# Patient Record
Sex: Male | Born: 1950 | Race: White | Hispanic: No | Smoking: Former smoker
Health system: Southern US, Community
[De-identification: ages and names within clinical notes are randomized; demographics above are authoritative.]

## PROBLEM LIST (undated history)

## (undated) DIAGNOSIS — I10 Essential (primary) hypertension: Secondary | ICD-10-CM

## (undated) DIAGNOSIS — D6851 Activated protein C resistance: Secondary | ICD-10-CM

## (undated) DIAGNOSIS — I82409 Acute embolism and thrombosis of unspecified deep veins of unspecified lower extremity: Secondary | ICD-10-CM

## (undated) DIAGNOSIS — F329 Major depressive disorder, single episode, unspecified: Secondary | ICD-10-CM

## (undated) DIAGNOSIS — R269 Unspecified abnormalities of gait and mobility: Secondary | ICD-10-CM

## (undated) DIAGNOSIS — R413 Other amnesia: Secondary | ICD-10-CM

## (undated) DIAGNOSIS — F32A Depression, unspecified: Secondary | ICD-10-CM

## (undated) DIAGNOSIS — L409 Psoriasis, unspecified: Secondary | ICD-10-CM

## (undated) DIAGNOSIS — N289 Disorder of kidney and ureter, unspecified: Secondary | ICD-10-CM

## (undated) DIAGNOSIS — C801 Malignant (primary) neoplasm, unspecified: Secondary | ICD-10-CM

## (undated) DIAGNOSIS — E119 Type 2 diabetes mellitus without complications: Secondary | ICD-10-CM

## (undated) HISTORY — DX: Unspecified abnormalities of gait and mobility: R26.9

## (undated) HISTORY — DX: Psoriasis, unspecified: L40.9

## (undated) HISTORY — DX: Other amnesia: R41.3

## (undated) HISTORY — PX: NEPHRECTOMY: SHX65

---

## 2009-06-02 DEATH — deceased

## 2011-04-21 ENCOUNTER — Ambulatory Visit: Payer: Self-pay | Admitting: Oncology

## 2011-04-22 LAB — CEA: CEA: 1.3 ng/mL (ref 0.0–4.7)

## 2011-05-03 ENCOUNTER — Ambulatory Visit: Payer: Self-pay | Admitting: Oncology

## 2011-08-24 ENCOUNTER — Ambulatory Visit: Payer: Self-pay | Admitting: Unknown Physician Specialty

## 2012-02-15 ENCOUNTER — Ambulatory Visit: Payer: Self-pay | Admitting: Oncology

## 2012-02-16 LAB — CEA: CEA: 1.5 ng/mL (ref 0.0–4.7)

## 2012-03-02 ENCOUNTER — Ambulatory Visit: Payer: Self-pay | Admitting: Oncology

## 2012-05-22 ENCOUNTER — Ambulatory Visit: Payer: Self-pay | Admitting: Oncology

## 2012-05-30 ENCOUNTER — Ambulatory Visit: Payer: Self-pay | Admitting: Unknown Physician Specialty

## 2012-05-31 LAB — PATHOLOGY REPORT

## 2012-08-02 ENCOUNTER — Ambulatory Visit: Payer: Self-pay | Admitting: Oncology

## 2012-08-18 ENCOUNTER — Ambulatory Visit: Payer: Self-pay | Admitting: Oncology

## 2012-08-25 LAB — CBC CANCER CENTER
Eosinophil #: 0.1 x10 3/mm (ref 0.0–0.7)
Eosinophil %: 1.9 %
HCT: 45.7 % (ref 40.0–52.0)
HGB: 15.4 g/dL (ref 13.0–18.0)
MCH: 30.6 pg (ref 26.0–34.0)
MCHC: 33.8 g/dL (ref 32.0–36.0)
MCV: 91 fL (ref 80–100)
Neutrophil #: 5.1 x10 3/mm (ref 1.4–6.5)
Platelet: 146 x10 3/mm — ABNORMAL LOW (ref 150–440)

## 2012-08-25 LAB — BASIC METABOLIC PANEL
BUN: 39 mg/dL — ABNORMAL HIGH (ref 7–18)
Chloride: 96 mmol/L — ABNORMAL LOW (ref 98–107)
Creatinine: 2.36 mg/dL — ABNORMAL HIGH (ref 0.60–1.30)
EGFR (African American): 33 — ABNORMAL LOW
Glucose: 509 mg/dL (ref 65–99)
Sodium: 131 mmol/L — ABNORMAL LOW (ref 136–145)

## 2012-08-28 LAB — CEA: CEA: 2.7 ng/mL (ref 0.0–4.7)

## 2012-09-02 ENCOUNTER — Ambulatory Visit: Payer: Self-pay | Admitting: Oncology

## 2013-02-20 ENCOUNTER — Ambulatory Visit: Payer: Self-pay | Admitting: Oncology

## 2016-09-19 ENCOUNTER — Emergency Department (HOSPITAL_BASED_OUTPATIENT_CLINIC_OR_DEPARTMENT_OTHER): Payer: Medicare Other

## 2016-09-19 ENCOUNTER — Observation Stay (HOSPITAL_BASED_OUTPATIENT_CLINIC_OR_DEPARTMENT_OTHER)
Admission: EM | Admit: 2016-09-19 | Discharge: 2016-09-20 | Disposition: A | Discharge status: Home / Self Care | Payer: Medicare Other | Attending: Internal Medicine | Admitting: Internal Medicine

## 2016-09-19 ENCOUNTER — Encounter (HOSPITAL_BASED_OUTPATIENT_CLINIC_OR_DEPARTMENT_OTHER): Payer: Self-pay | Admitting: Emergency Medicine

## 2016-09-19 DIAGNOSIS — F32A Depression, unspecified: Secondary | ICD-10-CM | POA: Diagnosis present

## 2016-09-19 DIAGNOSIS — Z85048 Personal history of other malignant neoplasm of rectum, rectosigmoid junction, and anus: Secondary | ICD-10-CM | POA: Insufficient documentation

## 2016-09-19 DIAGNOSIS — N183 Chronic kidney disease, stage 3 unspecified: Secondary | ICD-10-CM | POA: Diagnosis present

## 2016-09-19 DIAGNOSIS — R51 Headache: Secondary | ICD-10-CM | POA: Diagnosis not present

## 2016-09-19 DIAGNOSIS — I252 Old myocardial infarction: Secondary | ICD-10-CM | POA: Insufficient documentation

## 2016-09-19 DIAGNOSIS — E119 Type 2 diabetes mellitus without complications: Secondary | ICD-10-CM

## 2016-09-19 DIAGNOSIS — I6523 Occlusion and stenosis of bilateral carotid arteries: Secondary | ICD-10-CM | POA: Diagnosis not present

## 2016-09-19 DIAGNOSIS — I10 Essential (primary) hypertension: Secondary | ICD-10-CM | POA: Diagnosis not present

## 2016-09-19 DIAGNOSIS — D6851 Activated protein C resistance: Secondary | ICD-10-CM | POA: Diagnosis not present

## 2016-09-19 DIAGNOSIS — I639 Cerebral infarction, unspecified: Secondary | ICD-10-CM | POA: Diagnosis not present

## 2016-09-19 DIAGNOSIS — F329 Major depressive disorder, single episode, unspecified: Secondary | ICD-10-CM | POA: Diagnosis not present

## 2016-09-19 DIAGNOSIS — N179 Acute kidney failure, unspecified: Secondary | ICD-10-CM | POA: Diagnosis not present

## 2016-09-19 DIAGNOSIS — G4700 Insomnia, unspecified: Secondary | ICD-10-CM | POA: Diagnosis not present

## 2016-09-19 DIAGNOSIS — R4701 Aphasia: Secondary | ICD-10-CM | POA: Diagnosis not present

## 2016-09-19 DIAGNOSIS — I129 Hypertensive chronic kidney disease with stage 1 through stage 4 chronic kidney disease, or unspecified chronic kidney disease: Secondary | ICD-10-CM | POA: Diagnosis not present

## 2016-09-19 DIAGNOSIS — E1122 Type 2 diabetes mellitus with diabetic chronic kidney disease: Secondary | ICD-10-CM | POA: Insufficient documentation

## 2016-09-19 DIAGNOSIS — Z91041 Radiographic dye allergy status: Secondary | ICD-10-CM | POA: Diagnosis not present

## 2016-09-19 DIAGNOSIS — Z7901 Long term (current) use of anticoagulants: Secondary | ICD-10-CM | POA: Diagnosis not present

## 2016-09-19 DIAGNOSIS — I071 Rheumatic tricuspid insufficiency: Secondary | ICD-10-CM | POA: Insufficient documentation

## 2016-09-19 DIAGNOSIS — Z794 Long term (current) use of insulin: Secondary | ICD-10-CM | POA: Insufficient documentation

## 2016-09-19 DIAGNOSIS — F5101 Primary insomnia: Secondary | ICD-10-CM

## 2016-09-19 DIAGNOSIS — Z905 Acquired absence of kidney: Secondary | ICD-10-CM | POA: Diagnosis not present

## 2016-09-19 DIAGNOSIS — Z86718 Personal history of other venous thrombosis and embolism: Secondary | ICD-10-CM

## 2016-09-19 DIAGNOSIS — Z85528 Personal history of other malignant neoplasm of kidney: Secondary | ICD-10-CM | POA: Diagnosis not present

## 2016-09-19 DIAGNOSIS — R2990 Unspecified symptoms and signs involving the nervous system: Secondary | ICD-10-CM | POA: Diagnosis present

## 2016-09-19 HISTORY — DX: Type 2 diabetes mellitus without complications: E11.9

## 2016-09-19 HISTORY — DX: Malignant (primary) neoplasm, unspecified: C80.1

## 2016-09-19 HISTORY — DX: Activated protein C resistance: D68.51

## 2016-09-19 HISTORY — DX: Disorder of kidney and ureter, unspecified: N28.9

## 2016-09-19 HISTORY — DX: Major depressive disorder, single episode, unspecified: F32.9

## 2016-09-19 HISTORY — DX: Depression, unspecified: F32.A

## 2016-09-19 HISTORY — DX: Essential (primary) hypertension: I10

## 2016-09-19 HISTORY — DX: Acute embolism and thrombosis of unspecified deep veins of unspecified lower extremity: I82.409

## 2016-09-19 LAB — CBC
HEMATOCRIT: 45.9 % (ref 39.0–52.0)
Hemoglobin: 15.5 g/dL (ref 13.0–17.0)
MCH: 31.3 pg (ref 26.0–34.0)
MCHC: 33.8 g/dL (ref 30.0–36.0)
MCV: 92.5 fL (ref 78.0–100.0)
PLATELETS: 172 10*3/uL (ref 150–400)
RBC: 4.96 MIL/uL (ref 4.22–5.81)
RDW: 13.2 % (ref 11.5–15.5)
WBC: 7.1 10*3/uL (ref 4.0–10.5)

## 2016-09-19 LAB — GLUCOSE, CAPILLARY: Glucose-Capillary: 89 mg/dL (ref 65–99)

## 2016-09-19 LAB — DIFFERENTIAL
BASOS ABS: 0 10*3/uL (ref 0.0–0.1)
BASOS PCT: 0 %
EOS ABS: 0.1 10*3/uL (ref 0.0–0.7)
Eosinophils Relative: 2 %
Lymphocytes Relative: 36 %
Lymphs Abs: 2.6 10*3/uL (ref 0.7–4.0)
Monocytes Absolute: 0.5 10*3/uL (ref 0.1–1.0)
Monocytes Relative: 8 %
NEUTROS ABS: 3.8 10*3/uL (ref 1.7–7.7)
NEUTROS PCT: 54 %

## 2016-09-19 LAB — CBG MONITORING, ED: Glucose-Capillary: 240 mg/dL — ABNORMAL HIGH (ref 65–99)

## 2016-09-19 LAB — COMPREHENSIVE METABOLIC PANEL
ALBUMIN: 3.8 g/dL (ref 3.5–5.0)
ALT: 32 U/L (ref 17–63)
AST: 34 U/L (ref 15–41)
Alkaline Phosphatase: 41 U/L (ref 38–126)
Anion gap: 7 (ref 5–15)
BUN: 33 mg/dL — AB (ref 6–20)
CHLORIDE: 103 mmol/L (ref 101–111)
CO2: 28 mmol/L (ref 22–32)
CREATININE: 2.19 mg/dL — AB (ref 0.61–1.24)
Calcium: 9.8 mg/dL (ref 8.9–10.3)
GFR calc Af Amer: 34 mL/min — ABNORMAL LOW (ref >60)
GFR, EST NON AFRICAN AMERICAN: 30 mL/min — AB (ref >60)
GLUCOSE: 198 mg/dL — AB (ref 65–99)
POTASSIUM: 4.2 mmol/L (ref 3.5–5.1)
SODIUM: 138 mmol/L (ref 135–145)
Total Bilirubin: 0.7 mg/dL (ref 0.3–1.2)
Total Protein: 7.4 g/dL (ref 6.5–8.1)

## 2016-09-19 LAB — TROPONIN I

## 2016-09-19 LAB — APTT: APTT: 44 s — AB (ref 24–36)

## 2016-09-19 LAB — PROTIME-INR
INR: 2.1
Prothrombin Time: 23.9 seconds — ABNORMAL HIGH (ref 11.4–15.2)

## 2016-09-19 MED ORDER — ATORVASTATIN CALCIUM 10 MG PO TABS
20.0000 mg | ORAL_TABLET | Freq: Every day | ORAL | Status: DC
  Administered 2016-09-20: 20 mg via ORAL
  Filled 2016-09-19: qty 2

## 2016-09-19 MED ORDER — OMEGA-3-ACID ETHYL ESTERS 1 G PO CAPS
2.0000 g | ORAL_CAPSULE | Freq: Every day | ORAL | Status: DC
  Administered 2016-09-20: 2 g via ORAL
  Filled 2016-09-19: qty 2

## 2016-09-19 MED ORDER — ACETAMINOPHEN 160 MG/5ML PO SOLN: 650.0000 mg | ORAL | Status: DC | PRN

## 2016-09-19 MED ORDER — SENNOSIDES-DOCUSATE SODIUM 8.6-50 MG PO TABS: 1.0000 | ORAL_TABLET | Freq: Every evening | ORAL | Status: DC | PRN

## 2016-09-19 MED ORDER — INSULIN ASPART 100 UNIT/ML ~~LOC~~ SOLN: 0.0000 [IU] | Freq: Every day | SUBCUTANEOUS | Status: DC

## 2016-09-19 MED ORDER — ADULT MULTIVITAMIN W/MINERALS CH
1.0000 | ORAL_TABLET | Freq: Every day | ORAL | Status: DC
  Administered 2016-09-20: 1 via ORAL
  Filled 2016-09-19: qty 1

## 2016-09-19 MED ORDER — GABAPENTIN 300 MG PO CAPS
300.0000 mg | ORAL_CAPSULE | Freq: Every day | ORAL | Status: DC
  Administered 2016-09-19: 300 mg via ORAL
  Filled 2016-09-19: qty 1

## 2016-09-19 MED ORDER — ACETAMINOPHEN 325 MG PO TABS: 650.0000 mg | ORAL_TABLET | ORAL | Status: DC | PRN

## 2016-09-19 MED ORDER — SODIUM CHLORIDE 0.9 % IV SOLN
INTRAVENOUS | Status: DC
  Administered 2016-09-19: 22:00:00 via INTRAVENOUS

## 2016-09-19 MED ORDER — STROKE: EARLY STAGES OF RECOVERY BOOK
Freq: Once | Status: AC
  Administered 2016-09-19: 22:00:00

## 2016-09-19 MED ORDER — ACETAMINOPHEN 650 MG RE SUPP: 650.0000 mg | RECTAL | Status: DC | PRN

## 2016-09-19 MED ORDER — ALPRAZOLAM 0.5 MG PO TABS
0.5000 mg | ORAL_TABLET | Freq: Every evening | ORAL | Status: DC | PRN
  Administered 2016-09-19: 0.5 mg via ORAL
  Filled 2016-09-19: qty 1

## 2016-09-19 MED ORDER — FENOFIBRATE 160 MG PO TABS
160.0000 mg | ORAL_TABLET | Freq: Every day | ORAL | Status: DC
  Administered 2016-09-20: 160 mg via ORAL
  Filled 2016-09-19: qty 1

## 2016-09-19 MED ORDER — GABAPENTIN 300 MG PO CAPS: 300.0000 mg | ORAL_CAPSULE | Freq: Two times a day (BID) | ORAL | Status: DC | PRN

## 2016-09-19 MED ORDER — INSULIN ASPART 100 UNIT/ML ~~LOC~~ SOLN
0.0000 [IU] | Freq: Three times a day (TID) | SUBCUTANEOUS | Status: DC
  Administered 2016-09-20: 8 [IU] via SUBCUTANEOUS

## 2016-09-19 NOTE — ED Notes (Signed)
Patient transported to CT 

## 2016-09-19 NOTE — Progress Notes (Signed)
ANTICOAGULATION CONSULT NOTE - Initial Consult  Pharmacy Consult for Warfarin Indication: factor 7 defiiciency  Allergies  Allergen Reactions  . Ivp Dye [Iodinated Diagnostic Agents] Other (See Comments)    Cannot take due to kidney disease     Patient Measurements: Height: 6' (182.9 cm) Weight: 291 lb 0.1 oz (132 kg) IBW/kg (Calculated) : 77.6  Vital Signs: Temp: 98.2 F (36.8 C) (02/18 2053) Temp Source: Oral (02/18 2053) BP: 174/79 (02/18 2053) Pulse Rate: 89 (02/18 2053)  Labs:  Recent Labs  09/19/16 1545  HGB 15.5  HCT 45.9  PLT 172  APTT 44*  LABPROT 23.9*  INR 2.10  CREATININE 2.19*  TROPONINI <0.03    Estimated Creatinine Clearance: 46.6 mL/min (by C-G formula based on SCr of 2.19 mg/dL (H)).   Medical History: Past Medical History:  Diagnosis Date  . Cancer ([place])   . Depression   . Diabetes mellitus without complication ([place])   . DVT (deep venous thrombosis) ([place])   . Factor V Leiden ([place])   . Hypertension   . MI (myocardial infarction)   . Renal disorder     Assessment: 66 year old male on Coumadin PTA for factor 7 deficiency INR = 2.1 on admission, last dose today Dose PTA = 3 mg Thur/Sun, 6 mg other days  Goal of Therapy:  INR 2-3 Monitor platelets by anticoagulation protocol: Yes   Plan:  No further Coumadin needed today Daily INR  Thank you Anette Guarneri, PharmD [phone]  09/19/2016,9:50 PM

## 2016-09-19 NOTE — Progress Notes (Signed)
Paged MD to notify that patient is on the unit and needs orders.

## 2016-09-19 NOTE — ED Provider Notes (Signed)
[facility] DEPT [facility] Provider Note   CSN: [id number] Arrival date & time: 09/19/16  1539   By signing my name below, I, Soijett Blue, attest that this documentation has been prepared under the direction and in the presence of Ripley Lovecchio Lyn Viann Nielson, MD. Electronically Signed: Soijett Blue, ED Scribe. 09/19/16. 3:51 PM.  History   Chief Complaint Chief Complaint  Patient presents with  . Aphasia    HPI Chad Chen is a 66 y.o. male with a PMHx of HTN, DM, colon CA, kidney CA, who presents to the Emergency Department complaining of speech difficulty onset 40 minutes ago PTA. Pt notes that he was eating at [facility] with his girlfriend, when his girlfriend noticed that it took him 8 minutes to form a sentence and get his thoughts together. Pt states that he feels as if he is at his baseline at this time. Pt reports associated symptoms of HA. He hasn't tried any medications for the relief of his symptoms. He denies weakness, fever, chills, and any other symptoms. Pt reports that he takes daily coumadin. Pt notes that he has had a left nephrectomy in 2011. Denies hx of stroke or MI in the past.    The history is provided by the patient and a significant other. No language interpreter was used.    Past Medical History:  Diagnosis Date  . Cancer ([place])   . Diabetes mellitus without complication ([place])   . Hypertension     There are no active problems to display for this patient.   Past Surgical History:  Procedure Laterality Date  . NEPHRECTOMY Left        Home Medications    Prior to Admission medications   Medication Sig Start Date End Date Taking? Authorizing Provider  warfarin (COUMADIN) 10 MG tablet Take 10 mg by mouth daily.   Yes Historical Provider, MD    Family History No family history on file.  Social History Social History  Substance Use Topics  . Smoking status: Never Smoker  . Smokeless tobacco: Never Used  . Alcohol use No     Allergies   Patient has  no known allergies.   Review of Systems Review of Systems  Constitutional: Negative for chills and fever.  Neurological: Positive for speech difficulty and headaches. Negative for weakness.     Physical Exam Updated Vital Signs BP 132/88   Pulse 88   Temp 97.8 F (36.6 C) (Oral)   Resp 15   Ht 6\' 2"  (1.88 m)   Wt 291 lb (132 kg)   SpO2 94%   BMI 37.36 kg/m   Physical Exam  Constitutional: He is oriented to person, place, and time. He appears well-developed and well-nourished. No distress.  Obese white male.  HENT:  Head: Normocephalic and atraumatic.  Eyes: EOM are normal.  Neck: Neck supple.  Cardiovascular: Normal rate, regular rhythm and normal heart sounds.  Exam reveals no gallop and no friction rub.   No murmur heard. Pulmonary/Chest: Effort normal and breath sounds normal. No respiratory distress. He has no wheezes. He has no rales.  Abdominal: He exhibits no distension.  Midline scar  Musculoskeletal: Normal range of motion.  Neurological: He is alert and oriented to person, place, and time.  Cranial nerves 2-12 intact. Negative pronator drift. Moving all four extremities.  Skin: Skin is warm and dry.  Psychiatric: He has a normal mood and affect. His behavior is normal.  Nursing note and vitals reviewed.    ED Treatments / Results  DIAGNOSTIC STUDIES: Oxygen Saturation is 95% on RA, adequate by my interpretation.    COORDINATION OF CARE: 3:46 PM Discussed treatment plan with pt at bedside which includes EKG, labs, CT head, and pt agreed to plan.   Labs (all labs ordered are listed, but only abnormal results are displayed) Labs Reviewed  PROTIME-INR - Abnormal; Notable for the following:       Result Value   Prothrombin Time 23.9 (*)    All other components within normal limits  APTT - Abnormal; Notable for the following:    aPTT 44 (*)    All other components within normal limits  COMPREHENSIVE METABOLIC PANEL - Abnormal; Notable for the  following:    Glucose, Bld 198 (*)    BUN 33 (*)    Creatinine, Ser 2.19 (*)    GFR calc non Af Amer 30 (*)    GFR calc Af Amer 34 (*)    All other components within normal limits  CBG MONITORING, ED - Abnormal; Notable for the following:    Glucose-Capillary 240 (*)    All other components within normal limits  CBC  DIFFERENTIAL  TROPONIN I    EKG  EKG Interpretation  Date/Time:  Sunday September 19 2016 15:43:42 EST Ventricular Rate:  81 PR Interval:    QRS Duration: 96 QT Interval:  367 QTC Calculation: 426 R Axis:   -28 Text Interpretation:  Sinus rhythm Probable left ventricular hypertrophy Inferior infarct, old Anterior Q waves, possibly due to LVH Baseline wander in lead(s) V3 V5 no evidence of acute ischemia Confirmed by Gerald Leitz ([id number]) on 09/19/2016 4:35:50 PM       Radiology Ct Head Wo Contrast  Result Date: 09/19/2016 CLINICAL DATA:  Patient with difficulty speaking. EXAM: CT HEAD WITHOUT CONTRAST TECHNIQUE: Contiguous axial images were obtained from the base of the skull through the vertex without intravenous contrast. COMPARISON:  None. FINDINGS: Brain: Ventricles and sulci are appropriate for patient's age. Periventricular and subcortical white matter hypodensity compatible with chronic microvascular ischemic changes. No evidence for acute cortically based infarct, intracranial hemorrhage, mass lesion or mass-effect. Small left basal ganglia lacunar infarct. Vascular: Unremarkable. Skull: Intact. Sinuses/Orbits: Paranasal sinuses are well aerated. Mastoid air cells unremarkable. Other: None. IMPRESSION: No acute intracranial process. Chronic microvascular ischemic changes. Electronically Signed   By: Lovey Newcomer M.D.   On: 09/19/2016 16:23    Procedures Procedures (including critical care time)  Medications Ordered in ED Medications - No data to display   Initial Impression / Assessment and Plan / ED Course  I have reviewed the triage vital signs and  the nursing notes.  Pertinent labs & imaging results that were available during my care of the patient were reviewed by me and considered in my medical decision making (see chart for details).    Patient is a 66 year old male presenting with acute onset of inability to speak. This is since resolved. Patient was at [facility] with his girlfriend when this happened. Lasted approximately 30 minutes. Patient was using [place] words but is not making sense and unable to finish the conversation. Patient had no weakness of the time. Patient is on Coumadin for factor V Leiden, has a history of hypertension hyperlipidemia diabetes and kidney neoplasm.  Suspicious for TIA. INR is at goal 2.1.  Will admit to [facility] with neurology following.  4:54 PM discussed neurology.  Final Clinical Impressions(s) / ED Diagnoses   Final diagnoses:  None    New Prescriptions New Prescriptions  No medications on file   I personally performed the services described in this documentation, which was scribed in my presence. The recorded information has been reviewed and is accurate.       Kamau Weatherall Julio Alm, MD 09/19/16 1654

## 2016-09-19 NOTE — ED Notes (Signed)
ED Provider at bedside. 

## 2016-09-19 NOTE — Progress Notes (Addendum)
66 y.o. male with a PMHx of HTN, DM, colon CA, kidney CA, who presents to the Emergency Department complaining of speech difficulty onset 40 minutes ago prior to arrival. tx from [facility] Accepted to stroke floor for CVA work up On coumadin for factor 7 deficiency,INR 2.1

## 2016-09-19 NOTE — ED Triage Notes (Signed)
Pt girlfriend states it took him 8 minutes to find his words during their conversation. Pt is currently able to speak clearly and answer questions appropriately.

## 2016-09-19 NOTE — Progress Notes (Signed)
Patient admitted from Urgent care. Patient alert and oriented x 4. Patient oriented to room and made comfortable. Tele placed and was verified

## 2016-09-19 NOTE — ED Triage Notes (Signed)
Sudden onset of difficulty finding words 30 min ago. Pt advises he knows what he wants to say but is having difficulty finding words. Pt denies pain, denies weakness in arms and legs.

## 2016-09-19 NOTE — H&P (Signed)
History and Physical    Chad Chen [id number] DOB: 1951/06/14 DOA: 09/19/2016  PCP: No PCP Per Patient   Patient coming from: Home, by way of [facility]   Chief Complaint: Transient expressive aphasia   HPI: Chad Chen is a 66 y.o. male with medical history significant for factor V Leiden mutation and history of DVT on Coumadin, insulin-dependent diabetes mellitus, depression, hypertension, chronic kidney disease stage III, and remote renal and colorectal cancers, both status post resection, now presenting in transfer from [place] for evaluation of transient expressive aphasia. Patient reports that he was in his usual state of health this morning and was having an uneventful day until, while eating lunch at a restaurant with his girlfriend, when he had difficulty with word-finding and reportedly spent close to 30 minutes trying to tell his girlfriend about something he had seen on TV. He describes starting a sentence, but then being unable to find the words to complete his statement. There was a very mild frontal headache that had developed shortly prior to this, which is unusual for the patient. He was taken to [facility] for evaluation, and upon his arrival, reports being back to his usual state. He denies any recent fall or head trauma, denies any similar symptoms previously, denies any recent alcohol or illicit substance use, denies change in vision or hearing, and denies any focal numbness or weakness. He was recently started on Xanax nightly for insomnia, but had not yet started taking this. His girlfriend at the bedside and notes that he had taken his sulfonylurea this morning, but did not eat anything until a couple hours later. There was no diaphoresis, shakes, lightheadedness, or nausea.  [facility] ED Course: Upon arrival to the [facility] ED, patient is found to be afebrile, saturating adequately on room air, and with vital signs stable. EKG features a sinus rhythm  with no prior for comparison. Noncontrast head CT is negative for acute intracranial abnormality, but notable for chronic microvascular ischemic changes. Chemistry panel is notable for a BUN of 33 and serum creatinine of 2.19, up from an apparent baseline of 1.8. CBC is unremarkable, INR is therapeutic at 2.10, and troponin is undetectable. Neurology was consulted by the ED physician and advised a medical admission to [facility] for further evaluation and management of transient expressive aphasia concerning for TIA/CVA. Patient remained hemodynamically stable and in no respiratory distress at the outside hospital, with known neurologic deficits elicited. He 6 Cecily passed a bedside swallow evaluation. He has been transported to [facility] where he will be observed on the telemetry unit.  Review of Systems:  All other systems reviewed and apart from HPI, are negative.  Past Medical History:  Diagnosis Date  . Cancer ([place])   . Depression   . Diabetes mellitus without complication ([place])   . DVT (deep venous thrombosis) ([place])   . Factor V Leiden ([place])   . Hypertension   . MI (myocardial infarction)   . Renal disorder     Past Surgical History:  Procedure Laterality Date  . NEPHRECTOMY Left      reports that he has never smoked. He has never used smokeless tobacco. He reports that he does not drink alcohol. His drug history is not on file.  Allergies  Allergen Reactions  . Ivp Dye [Iodinated Diagnostic Agents] Other (See Comments)    Cannot take due to kidney disease     History reviewed. No pertinent family history.   Prior  to Admission medications   Medication Sig Start Date End Date Taking? Authorizing Provider  Adalimumab (HUMIRA PEN) 40 MG/0.8ML PNKT Inject 40 mg into the skin every 14 (fourteen) days.   Yes Historical Provider, MD  ALPRAZolam Duanne Moron) 0.5 MG tablet Take 0.5 mg by mouth at bedtime as needed for sleep.    Yes Historical Provider, MD    atorvastatin (LIPITOR) 20 MG tablet Take 20 mg by mouth daily.   Yes Historical Provider, MD  buPROPion (WELLBUTRIN XL) 150 MG 24 hr tablet Take 150 mg by mouth at bedtime.    Yes Historical Provider, MD  diphenhydramine-acetaminophen (TYLENOL PM) 25-500 MG TABS tablet Take 2 tablets by mouth at bedtime.   Yes Historical Provider, MD  fenofibrate 160 MG tablet Take 160 mg by mouth daily.   Yes Historical Provider, MD  gabapentin (NEURONTIN) 300 MG capsule Take 300 mg by mouth See admin instructions. Take 1 capsule (300 mg) by mouth daily at bedtime, may also take 1 capsule twice during the day as needed for foot pain   Yes Historical Provider, MD  glipiZIDE (GLUCOTROL XL) 2.5 MG 24 hr tablet Take 2.5 mg by mouth See admin instructions. Take 1 tablet (2.5 mg) by mouth twice daily - with breakfast and lunch   Yes Historical Provider, MD  liraglutide (VICTOZA) 18 MG/3ML SOPN Inject 1.2 mg into the skin daily.   Yes Historical Provider, MD  lisinopril (PRINIVIL,ZESTRIL) 40 MG tablet Take 40 mg by mouth daily.   Yes Historical Provider, MD  Multiple Vitamin (MULTIVITAMIN WITH MINERALS) TABS tablet Take 1 tablet by mouth daily.   Yes Historical Provider, MD  Omega-3 Fatty Acids (FISH OIL) 1000 MG CAPS Take 2,000 mg by mouth daily.   Yes Historical Provider, MD  pioglitazone (ACTOS) 15 MG tablet Take 15 mg by mouth at bedtime.   Yes Historical Provider, MD  PSYLLIUM PO Take 4 capsules by mouth daily.   Yes Historical Provider, MD  SODIUM BICARBONATE PO Take 2 tablets by mouth daily.   Yes Historical Provider, MD  warfarin (COUMADIN) 6 MG tablet Take 3-6 mg by mouth See admin instructions. Take 1/2 tablet (3 mg) by mouth on Sunday and Thursday mornings, take 1 tablet (6 mg) on Monday, Tuesday, Wednesday, Friday and Saturday mornings   Yes Historical Provider, MD    Physical Exam: Vitals:   09/19/16 1800 09/19/16 1830 09/19/16 1900 09/19/16 2053  BP: 128/92 131/97 138/99 (!) 174/79  Pulse: 79 88 89 89   Resp: 17 18 17 18   Temp:    98.2 F (36.8 C)  TempSrc:    Oral  SpO2: 95% 94% 93% 93%  Weight:    132 kg (291 lb 0.1 oz)  Height:    6' (1.829 m)      Constitutional: NAD, calm, comfortable, obese  Eyes: PERTLA, lids and conjunctivae normal ENMT: Mucous membranes are moist. Posterior pharynx clear of any exudate or lesions.   Neck: normal, supple, no masses, no thyromegaly Respiratory: clear to auscultation bilaterally, no wheezing, no crackles. Normal respiratory effort.   Cardiovascular: S1 & S2 heard, regular rate and rhythm. No extremity edema. No significant JVD. Abdomen: No distension, no tenderness, no masses palpated. Bowel sounds normal.  Musculoskeletal: no clubbing / cyanosis. No joint deformity upper and lower extremities. Normal muscle tone.  Skin: no significant rashes, lesions, ulcers. Warm, dry, well-perfused. Neurologic: CN 2-12 grossly intact. Sensation intact, DTR normal. Strength 5/5 in all 4 limbs.  Psychiatric: Normal judgment and insight. Alert and  oriented x 3. Normal mood and affect.     Labs on Admission: I have personally reviewed following labs and imaging studies  CBC:  Recent Labs Lab 09/19/16 1545  WBC 7.1  NEUTROABS 3.8  HGB 15.5  HCT 45.9  MCV 92.5  PLT [id number]   Basic Metabolic Panel:  Recent Labs Lab 09/19/16 1545  NA 138  K 4.2  CL 103  CO2 28  GLUCOSE 198*  BUN 33*  CREATININE 2.19*  CALCIUM 9.8   GFR: Estimated Creatinine Clearance: 46.6 mL/min (by C-G formula based on SCr of 2.19 mg/dL (H)). Liver Function Tests:  Recent Labs Lab 09/19/16 1545  AST 34  ALT 32  ALKPHOS 41  BILITOT 0.7  PROT 7.4  ALBUMIN 3.8   No results for input(s): LIPASE, AMYLASE in the last 168 hours. No results for input(s): AMMONIA in the last 168 hours. Coagulation Profile:  Recent Labs Lab 09/19/16 1545  INR 2.10   Cardiac Enzymes:  Recent Labs Lab 09/19/16 1545  TROPONINI <0.03   BNP (last 3 results) No results for  input(s): PROBNP in the last 8760 hours. HbA1C: No results for input(s): HGBA1C in the last 72 hours. CBG:  Recent Labs Lab 09/19/16 1612  GLUCAP 240*   Lipid Profile: No results for input(s): CHOL, HDL, LDLCALC, TRIG, CHOLHDL, LDLDIRECT in the last 72 hours. Thyroid Function Tests: No results for input(s): TSH, T4TOTAL, FREET4, T3FREE, THYROIDAB in the last 72 hours. Anemia Panel: No results for input(s): VITAMINB12, FOLATE, FERRITIN, TIBC, IRON, RETICCTPCT in the last 72 hours. Urine analysis: No results found for: COLORURINE, APPEARANCEUR, LABSPEC, PHURINE, GLUCOSEU, HGBUR, BILIRUBINUR, KETONESUR, PROTEINUR, UROBILINOGEN, NITRITE, LEUKOCYTESUR Sepsis Labs: @LABRCNTIP (procalcitonin:4,lacticidven:4) )No results found for this or any previous visit (from the past 240 hour(s)).   Radiological Exams on Admission: Ct Head Wo Contrast  Result Date: 09/19/2016 CLINICAL DATA:  Patient with difficulty speaking. EXAM: CT HEAD WITHOUT CONTRAST TECHNIQUE: Contiguous axial images were obtained from the base of the skull through the vertex without intravenous contrast. COMPARISON:  None. FINDINGS: Brain: Ventricles and sulci are appropriate for patient's age. Periventricular and subcortical white matter hypodensity compatible with chronic microvascular ischemic changes. No evidence for acute cortically based infarct, intracranial hemorrhage, mass lesion or mass-effect. Small left basal ganglia lacunar infarct. Vascular: Unremarkable. Skull: Intact. Sinuses/Orbits: Paranasal sinuses are well aerated. Mastoid air cells unremarkable. Other: None. IMPRESSION: No acute intracranial process. Chronic microvascular ischemic changes. Electronically Signed   By: Lovey Newcomer M.D.   On: 09/19/2016 16:23    EKG: Independently reviewed. Sinus rhythm, no STE or depressions, no priors available   Assessment/Plan  1. Expressive aphasia, resolved  - Pt had an episode today marked by expressive aphasia and lasting  ~30-40 minutes  - There was an associated mild frontal HA, but no CP or palpitations, and no vision or hearing change and no numbness or weakness  - Primary concern is for TIA or CVA; pt took his sulfonylurea a couple hours prior and had not eaten, but no other s/s to suggest a hypoglycemic event and glucose elevated on arrival  - tPA not considered as sxs had resolved  - Head CT negative for acute pathology, but notable for chronic microvascular ischemic changes - Neurology was consulted by the ED physician and much appreciated, will follow-up recs   - For now, plan to monitor on telemetry, obtain MRI/MRA, carotid dopplers, TTE, fasting lipid panel and A1c, PT/OT consultations    2. Acute kidney injury superimposed on CKD stage III  -  SCr is 2.19 on admission, up from an apparent baseline of 1.8  - He is s/p left nephrectomy in 2011 for renal cancer  - Plan to hold lisinopril and provide gentle IVF hydration; repeat chemistries in am    3. Insulin-dependent DM  - A1c was 7.6% in January 2018  - He is managed at home with Victoza, glipizide, and pioglitazone; these are held  - Check CBG with meals and qHS  - Start a moderate-intensity sliding-scale Novolog correctional    4. Factor V Leiden mutation, hx of DVT - Managed with warfarin; INR 2.10 on admission  - No suggestion of acute VTE   - Continue coumadin with pharmacy to dose   5. Hypertension  - BP at goal on admission  - Managed at home with lisinopril, currently held as above in light of AKI on CKD    6. Depression, insomnia  - Appears to be stable on admission  - Just prescribed Wellbutrin but did not start yet; will hold-off on starting now given the ongoing workup for neuro changes  - Continue Xanax qHS prn    7. History renal cancer, colorectal cancer  - Stable, is s/p remote resections without recurrence  - Due for repeat colonoscopy in 2020    DVT prophylaxis: warfarin  Code Status: Full  Family Communication:  Significant other updated at bedside Disposition Plan: Observe on telemetry Consults called: Neurology Admission status: Observation    Vianne Bulls, MD [facility] Pager [phone]  If 7PM-7AM, please contact night-coverage [email or web address] Password [facility]  09/19/2016, 9:45 PM

## 2016-09-20 ENCOUNTER — Observation Stay (HOSPITAL_COMMUNITY): Payer: Medicare Other

## 2016-09-20 ENCOUNTER — Observation Stay (HOSPITAL_BASED_OUTPATIENT_CLINIC_OR_DEPARTMENT_OTHER): Payer: Medicare Other

## 2016-09-20 DIAGNOSIS — Z794 Long term (current) use of insulin: Secondary | ICD-10-CM | POA: Diagnosis not present

## 2016-09-20 DIAGNOSIS — G459 Transient cerebral ischemic attack, unspecified: Secondary | ICD-10-CM

## 2016-09-20 DIAGNOSIS — D6851 Activated protein C resistance: Secondary | ICD-10-CM

## 2016-09-20 DIAGNOSIS — N183 Chronic kidney disease, stage 3 (moderate): Secondary | ICD-10-CM | POA: Diagnosis not present

## 2016-09-20 DIAGNOSIS — I639 Cerebral infarction, unspecified: Secondary | ICD-10-CM

## 2016-09-20 DIAGNOSIS — G451 Carotid artery syndrome (hemispheric): Secondary | ICD-10-CM

## 2016-09-20 DIAGNOSIS — R4701 Aphasia: Secondary | ICD-10-CM

## 2016-09-20 DIAGNOSIS — E119 Type 2 diabetes mellitus without complications: Secondary | ICD-10-CM

## 2016-09-20 DIAGNOSIS — I10 Essential (primary) hypertension: Secondary | ICD-10-CM | POA: Diagnosis not present

## 2016-09-20 LAB — VAS US CAROTID
LCCADSYS: -60 cm/s
LCCAPSYS: 114 cm/s
LEFT ECA DIAS: -10 cm/s
LEFT VERTEBRAL DIAS: -16 cm/s
Left CCA dist dias: -21 cm/s
Left CCA prox dias: 23 cm/s
Left ICA dist dias: -24 cm/s
Left ICA dist sys: -62 cm/s
Left ICA prox dias: -14 cm/s
Left ICA prox sys: -38 cm/s
RCCAPDIAS: 15 cm/s
RCCAPSYS: 83 cm/s
RIGHT ECA DIAS: -12 cm/s
RIGHT VERTEBRAL DIAS: -12 cm/s
Right cca dist sys: -60 cm/s

## 2016-09-20 LAB — ECHOCARDIOGRAM COMPLETE
HEIGHTINCHES: 72 in
Weight: 4656.12 oz

## 2016-09-20 LAB — LIPID PANEL
CHOL/HDL RATIO: 5.3 ratio
Cholesterol: 210 mg/dL — ABNORMAL HIGH (ref 0–200)
HDL: 40 mg/dL — AB (ref >40)
LDL CALC: 114 mg/dL — AB (ref 0–99)
Triglycerides: 282 mg/dL — ABNORMAL HIGH (ref <150)
VLDL: 56 mg/dL — AB (ref 0–40)

## 2016-09-20 LAB — GLUCOSE, CAPILLARY
GLUCOSE-CAPILLARY: 294 mg/dL — AB (ref 65–99)
Glucose-Capillary: 86 mg/dL (ref 65–99)

## 2016-09-20 LAB — PROTIME-INR
INR: 2.09
PROTHROMBIN TIME: 23.8 s — AB (ref 11.4–15.2)

## 2016-09-20 MED ORDER — WARFARIN SODIUM 6 MG PO TABS: 6.0000 mg | ORAL_TABLET | Freq: Every day | ORAL | Status: AC

## 2016-09-20 MED ORDER — WARFARIN SODIUM 6 MG PO TABS
6.0000 mg | ORAL_TABLET | Freq: Once | ORAL | Status: DC
  Filled 2016-09-20: qty 1

## 2016-09-20 MED ORDER — WARFARIN - PHARMACIST DOSING INPATIENT: Freq: Every day | Status: DC

## 2016-09-20 MED ORDER — ATORVASTATIN CALCIUM 40 MG PO TABS: 40.0000 mg | ORAL_TABLET | Freq: Every day | ORAL | 0 refills | Status: AC

## 2016-09-20 NOTE — Progress Notes (Signed)
VASCULAR LAB PRELIMINARY  PRELIMINARY  PRELIMINARY  PRELIMINARY  Carotid duplex completed.    Preliminary report:  1-39% ICA plaquing. Vertebral artery flow is antegrade.   Kenard Morawski, RVT 09/20/2016, 1:22 PM

## 2016-09-20 NOTE — Care Management Obs Status (Signed)
[place] NOTIFICATION   Patient Details  Name: Chad Chen MRN: [id number] Date of Birth: [birth year]   Medicare Observation Status Notification Given:  Yes (MRI negative)    Pollie Friar, RN 09/20/2016, 12:39 PM

## 2016-09-20 NOTE — Consult Note (Signed)
Requesting Physician: Dr. Eliseo Squires    Chief Complaint: TIA  History obtained from:  Patient     HPI:                                                                                                                                         Chad Chen is an 66 y.o. male presenting tot he hospital after having a 30 minute period in which he was having word finding difficulties. Symptoms fully resolved and he had no other symptoms. Currently back at baseline. MRI brain is negative for acute CVA.   Date last known well: Date: 09/20/2016 Time last known well: Time: 14:00 tPA Given: No: symptoms resolved   Past Medical History:  Diagnosis Date  . Cancer ([place])   . Depression   . Diabetes mellitus without complication ([place])   . DVT (deep venous thrombosis) ([place])   . Factor V Leiden ([place])   . Hypertension   . MI (myocardial infarction)   . Renal disorder     Past Surgical History:  Procedure Laterality Date  . NEPHRECTOMY Left     History reviewed. No pertinent family history. Social History:  reports that he has never smoked. He has never used smokeless tobacco. He reports that he does not drink alcohol. His drug history is not on file.  Allergies:  Allergies  Allergen Reactions  . Ivp Dye [Iodinated Diagnostic Agents] Other (See Comments)    Cannot take due to kidney disease     Medications:                                                                                                                           Prior to Admission:  Prescriptions Prior to Admission  Medication Sig Dispense Refill Last Dose  . Adalimumab (HUMIRA PEN) 40 MG/0.8ML PNKT Inject 40 mg into the skin every 14 (fourteen) days.   6 weeks ago  . ALPRAZolam (XANAX) 0.5 MG tablet Take 0.5 mg by mouth at bedtime as needed for sleep.    not yet started  . atorvastatin (LIPITOR) 20 MG tablet Take 20 mg by mouth daily.   09/19/2016 at Unknown time  . buPROPion (WELLBUTRIN XL) 150 MG 24 hr tablet Take 150 mg by  mouth at bedtime.    not yet started  . diphenhydramine-acetaminophen (TYLENOL PM) 25-500 MG TABS tablet Take 2 tablets  by mouth at bedtime.   09/18/2016 at Unknown time  . fenofibrate 160 MG tablet Take 160 mg by mouth daily.   09/19/2016 at Unknown time  . gabapentin (NEURONTIN) 300 MG capsule Take 300 mg by mouth See admin instructions. Take 1 capsule (300 mg) by mouth daily at bedtime, may also take 1 capsule twice during the day as needed for foot pain   09/18/2016 at Unknown  . glipiZIDE (GLUCOTROL XL) 2.5 MG 24 hr tablet Take 2.5 mg by mouth See admin instructions. Take 1 tablet (2.5 mg) by mouth twice daily - with breakfast and lunch   09/19/2016 at lunch  . liraglutide (VICTOZA) 18 MG/3ML SOPN Inject 1.2 mg into the skin daily.   09/19/2016 at am  . lisinopril (PRINIVIL,ZESTRIL) 40 MG tablet Take 40 mg by mouth daily.   09/19/2016 at Unknown time  . Multiple Vitamin (MULTIVITAMIN WITH MINERALS) TABS tablet Take 1 tablet by mouth daily.   09/19/2016 at Unknown time  . Omega-3 Fatty Acids (FISH OIL) 1000 MG CAPS Take 2,000 mg by mouth daily.   09/19/2016 at Unknown time  . pioglitazone (ACTOS) 15 MG tablet Take 15 mg by mouth at bedtime.   09/18/2016 at Unknown time  . PSYLLIUM PO Take 4 capsules by mouth daily.   09/19/2016 at Unknown time  . SODIUM BICARBONATE PO Take 2 tablets by mouth daily.   09/19/2016 at Unknown time  . warfarin (COUMADIN) 6 MG tablet Take 3-6 mg by mouth See admin instructions. Take 1/2 tablet (3 mg) by mouth on Sunday and Thursday mornings, take 1 tablet (6 mg) on Monday, Tuesday, Wednesday, Friday and Saturday mornings   09/19/2016 at 1000   Scheduled: . atorvastatin  20 mg Oral Daily  . fenofibrate  160 mg Oral Daily  . gabapentin  300 mg Oral QHS  . insulin aspart  0-15 Units Subcutaneous TID WC  . insulin aspart  0-5 Units Subcutaneous QHS  . multivitamin with minerals  1 tablet Oral Daily  . omega-3 acid ethyl esters  2 g Oral Daily  . warfarin  6 mg Oral ONCE-1800  .  Warfarin - Pharmacist Dosing Inpatient   Does not apply q1800    ROS:                                                                                                                                       History obtained from the patient  General ROS: negative for - chills, fatigue, fever, night sweats, weight gain or weight loss Psychological ROS: negative for - behavioral disorder, hallucinations, memory difficulties, mood swings or suicidal ideation Ophthalmic ROS: negative for - blurry vision, double vision, eye pain or loss of vision ENT ROS: negative for - epistaxis, nasal discharge, oral lesions, sore throat, tinnitus or vertigo Allergy and Immunology ROS: negative for - hives or itchy/watery eyes Hematological and Lymphatic ROS: negative for - bleeding problems,  bruising or swollen lymph nodes Endocrine ROS: negative for - galactorrhea, hair pattern changes, polydipsia/polyuria or temperature intolerance Respiratory ROS: negative for - cough, hemoptysis, shortness of breath or wheezing Cardiovascular ROS: negative for - chest pain, dyspnea on exertion, edema or irregular heartbeat Gastrointestinal ROS: negative for - abdominal pain, diarrhea, hematemesis, nausea/vomiting or stool incontinence Genito-Urinary ROS: negative for - dysuria, hematuria, incontinence or urinary frequency/urgency Musculoskeletal ROS: negative for - joint swelling or muscular weakness Neurological ROS: as noted in HPI Dermatological ROS: negative for rash and skin lesion changes  Neurologic Examination:                                                                                                      Blood pressure 139/81, pulse 84, temperature 98.3 F (36.8 C), temperature source Oral, resp. rate 18, height 6' (1.829 m), weight 132 kg (291 lb 0.1 oz), SpO2 93 %.  HEENT-  Normocephalic, no lesions, without obvious abnormality.  Normal external eye and conjunctiva.  Normal TM's bilaterally.  Normal auditory  canals and external ears. Normal external nose, mucus membranes and septum.  Normal pharynx. Cardiovascular- S1, S2 normal, pulses palpable throughout   Lungs- chest clear, no wheezing, rales, normal symmetric air entry Abdomen- normal findings: bowel sounds normal Extremities- no edema Lymph-no adenopathy palpable Musculoskeletal-no joint tenderness, deformity or swelling Skin-warm and dry, no hyperpigmentation, vitiligo, or suspicious lesions  Neurological Examination Mental Status: Alert, oriented, thought content appropriate.  Speech fluent without evidence of aphasia.  Able to follow 3 step commands without difficulty. Cranial Nerves: II: Discs flat bilaterally; Visual fields grossly normal,  III,IV, VI: ptosis not present, extra-ocular motions intact bilaterally, pupils equal, round, reactive to light and accommodation V,VII: smile symmetric, facial light touch sensation normal bilaterally VIII: hearing normal bilaterally IX,X: uvula rises symmetrically XI: bilateral shoulder shrug XII: midline tongue extension Motor: Right : Upper extremity   5/5    Left:     Upper extremity   5/5  Lower extremity   5/5     Lower extremity   5/5 Tone and bulk:normal tone throughout; no atrophy noted Sensory: Pinprick and light touch intact throughout, bilaterally Deep Tendon Reflexes: 2+ and symmetric throughout Plantars: Right: downgoing   Left: downgoing Cerebellar: normal finger-to-nose, normal rapid alternating movements and normal heel-to-shin test Gait: not tested       Lab Results: Basic Metabolic Panel:  Recent Labs Lab 09/19/16 1545  NA 138  K 4.2  CL 103  CO2 28  GLUCOSE 198*  BUN 33*  CREATININE 2.19*  CALCIUM 9.8    Liver Function Tests:  Recent Labs Lab 09/19/16 1545  AST 34  ALT 32  ALKPHOS 41  BILITOT 0.7  PROT 7.4  ALBUMIN 3.8   No results for input(s): LIPASE, AMYLASE in the last 168 hours. No results for input(s): AMMONIA in the last 168  hours.  CBC:  Recent Labs Lab 09/19/16 1545  WBC 7.1  NEUTROABS 3.8  HGB 15.5  HCT 45.9  MCV 92.5  PLT 172    Cardiac Enzymes:  Recent Labs Lab 09/19/16  [place] <0.03    Lipid Panel:  Recent Labs Lab 09/20/16 0339  CHOL 210*  TRIG 282*  HDL 40*  CHOLHDL 5.3  VLDL 56*  LDLCALC 114*    CBG:  Recent Labs Lab 09/19/16 1612 09/19/16 2152 09/20/16 0633 09/20/16 1102  GLUCAP 240* 89 86 294*    Microbiology: No results found for this or any previous visit.  Coagulation Studies:  Recent Labs  09/19/16 1545 09/20/16 0339  LABPROT 23.9* 23.8*  INR 2.10 2.09    Imaging: Ct Head Wo Contrast  Result Date: 09/19/2016 CLINICAL DATA:  Patient with difficulty speaking. EXAM: CT HEAD WITHOUT CONTRAST TECHNIQUE: Contiguous axial images were obtained from the base of the skull through the vertex without intravenous contrast. COMPARISON:  None. FINDINGS: Brain: Ventricles and sulci are appropriate for patient's age. Periventricular and subcortical white matter hypodensity compatible with chronic microvascular ischemic changes. No evidence for acute cortically based infarct, intracranial hemorrhage, mass lesion or mass-effect. Small left basal ganglia lacunar infarct. Vascular: Unremarkable. Skull: Intact. Sinuses/Orbits: Paranasal sinuses are well aerated. Mastoid air cells unremarkable. Other: None. IMPRESSION: No acute intracranial process. Chronic microvascular ischemic changes. Electronically Signed   By: Lovey Newcomer M.D.   On: 09/19/2016 16:23   Mr Brain Wo Contrast  Result Date: 09/20/2016 CLINICAL DATA:  Speech disturbance. Difficulty with word finding. Frontal headache. EXAM: MRI HEAD WITHOUT CONTRAST MRA HEAD WITHOUT CONTRAST TECHNIQUE: Multiplanar, multiecho pulse sequences of the brain and surrounding structures were obtained without intravenous contrast. Angiographic images of the head were obtained using MRA technique without contrast. COMPARISON:  CT  09/19/2016 FINDINGS: MRI HEAD FINDINGS Brain: Diffusion imaging does not show any acute or subacute infarction. The brainstem and cerebellum are normal. Cerebral hemispheres show moderate changes of chronic small-vessel disease of the deep white matter. No cortical or large vessel territory infarction. No mass lesion, hemorrhage, hydrocephalus or extra-axial collection. No pituitary mass. Vascular: Major vessels at the base of the brain show flow. Skull and upper cervical spine: Negative Sinuses/Orbits: Clear/normal Other: None MRA HEAD FINDINGS Both internal carotid arteries are widely patent into the brain. No siphon stenosis. The anterior and middle cerebral vessels are normal without proximal stenosis, aneurysm or vascular malformation. Both posterior cerebral arteries take a fetal origin from the anterior circulation. Both vertebral arteries are patent with the left being dominant. Right vertebral artery terminates in PICA up. No basilar stenosis. Posterior circulation branch vessels are normal. Basilar artery is diminutive because of the fetal origin PCA anatomy. IMPRESSION: No acute or reversible finding. Moderate chronic small-vessel ischemic changes of the cerebral hemispheric deep white matter. No large vessel infarction either old or recent. Intracranial MR angiography normal evaluation of the large and medium size vessels . Electronically Signed   By: Nelson Chimes M.D.   On: 09/20/2016 06:38   Mr Jodene Nam Head/brain [id number] Cm  Result Date: 09/20/2016 CLINICAL DATA:  Speech disturbance. Difficulty with word finding. Frontal headache. EXAM: MRI HEAD WITHOUT CONTRAST MRA HEAD WITHOUT CONTRAST TECHNIQUE: Multiplanar, multiecho pulse sequences of the brain and surrounding structures were obtained without intravenous contrast. Angiographic images of the head were obtained using MRA technique without contrast. COMPARISON:  CT 09/19/2016 FINDINGS: MRI HEAD FINDINGS Brain: Diffusion imaging does not show any acute or  subacute infarction. The brainstem and cerebellum are normal. Cerebral hemispheres show moderate changes of chronic small-vessel disease of the deep white matter. No cortical or large vessel territory infarction. No mass lesion, hemorrhage, hydrocephalus or extra-axial collection. No pituitary mass. Vascular:  Major vessels at the base of the brain show flow. Skull and upper cervical spine: Negative Sinuses/Orbits: Clear/normal Other: None MRA HEAD FINDINGS Both internal carotid arteries are widely patent into the brain. No siphon stenosis. The anterior and middle cerebral vessels are normal without proximal stenosis, aneurysm or vascular malformation. Both posterior cerebral arteries take a fetal origin from the anterior circulation. Both vertebral arteries are patent with the left being dominant. Right vertebral artery terminates in PICA up. No basilar stenosis. Posterior circulation branch vessels are normal. Basilar artery is diminutive because of the fetal origin PCA anatomy. IMPRESSION: No acute or reversible finding. Moderate chronic small-vessel ischemic changes of the cerebral hemispheric deep white matter. No large vessel infarction either old or recent. Intracranial MR angiography normal evaluation of the large and medium size vessels . Electronically Signed   By: Nelson Chimes M.D.   On: 09/20/2016 06:38       Assessment and plan discussed with with attending physician and they are in agreement.    Etta Quill PA-C [facility] [phone]  09/20/2016, 12:32 PM   Assessment: 66 y.o. male with Factor V Leiden, on coumadin with INR of 2.10, LDL 114 presenting with 30 minutes expressive aphasia.   Stroke Risk Factors - diabetes mellitus and hypercoagulable state  Recommend:   Plan: 1. HgbA1c,  2. MRA  of the brain without contrast 3. PT consult, OT consult, Speech consult 4. Echocardiogram 5. Carotid dopplers 6. Prophylactic therapy-Anticoagulation: Coumadin- dose per  pharmacy Keep INR 2.5-3.0 7. Risk  Factor modification

## 2016-09-20 NOTE — Progress Notes (Signed)
PT Cancellation and Discharge Note  Patient Details Name: Chad Chen MRN: [id number] DOB: Sep 02, 1950   Cancelled Treatment:    Reason Eval/Treat Not Completed: PT screened, no needs identified, will sign off.  Pt up independently in room, able to pick up objects from floor, don/doff socks, and can stand and turn all the way around without deficit.  No further PT needs at this time, will sign off.     [place], PT [phone] 09/20/2016, 11:11 AM

## 2016-09-20 NOTE — Progress Notes (Signed)
  Echocardiogram 2D Echocardiogram has been performed.  Chad Chen 09/20/2016, 9:54 AM

## 2016-09-20 NOTE — Progress Notes (Signed)
Patient went down for MRI.

## 2016-09-20 NOTE — Progress Notes (Signed)
OT Cancellation Note  Patient Details Name: Chad Chen MRN: [id number] DOB: 08-Jul-1951   Cancelled Treatment:    Reason Eval/Treat Not Completed: OT screened, no needs identified, will sign off Spoke with PT megan and confirmed not acute needs at this time.  Vonita Moss   OTR/L Pager: [phone] Office: [phone] .  09/20/2016, 1:56 PM

## 2016-09-20 NOTE — Discharge Summary (Signed)
Physician Discharge Summary  Jaquori Hunsinger [id number] DOB: 06/24/1951 DOA: 09/19/2016  PCP: Lilian Coma., MD  Admit date: 09/19/2016 Discharge date: 09/20/2016   Recommendations for Outpatient Follow-Up:   1. INR goal increased to 2.5-3 2. HGbA1C pending 3. Ambulatory referral to neurology 4. lipitor increased-- LDL goal of <70 5. BMP 1 week re: cr   Discharge Diagnosis:   Principal Problem:   Stroke-like episode ([place]) Active Problems:   Depression   Insomnia   Diabetes mellitus, type II, insulin dependent (HCC)   Factor V Leiden mutation ([place])   History of DVT (deep vein thrombosis)   CKD (chronic kidney disease), stage III   AKI (acute kidney injury) ([place])   Essential hypertension   Expressive aphasia   Discharge disposition:  Home.  Discharge Condition: Improved.  Diet recommendation: Low sodium, heart healthy.  Carbohydrate-modified.  Wound care: None.   History of Present Illness:    Chad Chen is a 66 y.o. male with medical history significant for factor V Leiden mutation and history of DVT on Coumadin, insulin-dependent diabetes mellitus, depression, hypertension, chronic kidney disease stage III, and remote renal and colorectal cancers, both status post resection, now presenting in transfer from [facility] for evaluation of transient expressive aphasia. Patient reports that he was in his usual state of health this morning and was having an uneventful day until, while eating lunch at a restaurant with his girlfriend, when he had difficulty with word-finding and reportedly spent close to 30 minutes trying to tell his girlfriend about something he had seen on TV. He describes starting a sentence, but then being unable to find the words to complete his statement. There was a very mild frontal headache that had developed shortly prior to this, which is unusual for the patient. He was taken to [facility] for evaluation, and upon his  arrival, reports being back to his usual state. He denies any recent fall or head trauma, denies any similar symptoms previously, denies any recent alcohol or illicit substance use, denies change in vision or hearing, and denies any focal numbness or weakness. He was recently started on Xanax nightly for insomnia, but had not yet started taking this. His girlfriend at the bedside and notes that he had taken his sulfonylurea this morning, but did not eat anything until a couple hours later. There was no diaphoresis, shakes, lightheadedness, or nausea.   Hospital Course by Problem:   Expressive aphasia, resolved - TIA suspected - Pt had an episode today marked by expressive aphasia and lasting ~30-40 minutes  - There was an associated mild frontal HA, but no CP or palpitations, and no vision or hearing change and no numbness or weakness  - Primary concern is for TIA - tPA not considered as sxs had resolved  - Head CT negative for acute pathology, but notable for chronic microvascular ischemic changes -MRI/MRA negative -echo:Left ventricle: The cavity size was normal. Wall thickness was   increased in a pattern of moderate LVH. Systolic function was   normal. The estimated ejection fraction was in the range of 60%   to 65%. Wall motion was normal; there were no regional wall   motion abnormalities. Doppler parameters are consistent with both   elevated ventricular end-diastolic filling pressure and elevated   left atrial filling pressure. - Left atrium: The atrium was mildly dilated. - Atrial septum: No defect or patent foramen ovale was identified. Carotid: 1-39% ICA plaquing. Vertebral artery flow is antegrade -ambulatory referral  to neuro   Acute kidney injury superimposed on CKD stage III  - SCr is 2.19 on admission, up from an apparent baseline of 1.8  - He is s/p left nephrectomy in 2011 for renal cancer  -outpatient BMP  Insulin-dependent DM  - A1c was 7.6% in January 2018  -  resume home meds- PCP to adjust to goal of <7  Factor V Leiden mutation, hx of DVT - Managed with warfarin; INR 2.10 on admission  - increase goal to 2.5-3  Hypertension  - BP at goal on admission  - resume home meds  Depression, insomnia  - Appears to be stable on admission  - resume home meds   History renal cancer, colorectal cancer  - Stable, is s/p remote resections without recurrence  - Due for repeat colonoscopy in 2020     Medical Consultants:    Neuro   Discharge Exam:   Vitals:   09/20/16 1037 09/20/16 1332  BP: 139/81 (!) 149/87  Pulse: 84 73  Resp: 18 17  Temp: 98.3 F (36.8 C) 98.2 F (36.8 C)   Vitals:   09/20/16 0400 09/20/16 0630 09/20/16 1037 09/20/16 1332  BP: (!) 163/85 (!) 175/93 139/81 (!) 149/87  Pulse: 74 70 84 73  Resp: 18  18 17   Temp: 98.4 F (36.9 C)  98.3 F (36.8 C) 98.2 F (36.8 C)  TempSrc: Oral  Oral Oral  SpO2: 95% 96% 93% 96%  Weight:      Height:        Gen:  NAD    The results of significant diagnostics from this hospitalization (including imaging, microbiology, ancillary and laboratory) are listed below for reference.     Procedures and Diagnostic Studies:   Ct Head Wo Contrast  Result Date: 09/19/2016 CLINICAL DATA:  Patient with difficulty speaking. EXAM: CT HEAD WITHOUT CONTRAST TECHNIQUE: Contiguous axial images were obtained from the base of the skull through the vertex without intravenous contrast. COMPARISON:  None. FINDINGS: Brain: Ventricles and sulci are appropriate for patient's age. Periventricular and subcortical white matter hypodensity compatible with chronic microvascular ischemic changes. No evidence for acute cortically based infarct, intracranial hemorrhage, mass lesion or mass-effect. Small left basal ganglia lacunar infarct. Vascular: Unremarkable. Skull: Intact. Sinuses/Orbits: Paranasal sinuses are well aerated. Mastoid air cells unremarkable. Other: None. IMPRESSION: No acute intracranial  process. Chronic microvascular ischemic changes. Electronically Signed   By: Lovey Newcomer M.D.   On: 09/19/2016 16:23   Mr Brain Wo Contrast  Result Date: 09/20/2016 CLINICAL DATA:  Speech disturbance. Difficulty with word finding. Frontal headache. EXAM: MRI HEAD WITHOUT CONTRAST MRA HEAD WITHOUT CONTRAST TECHNIQUE: Multiplanar, multiecho pulse sequences of the brain and surrounding structures were obtained without intravenous contrast. Angiographic images of the head were obtained using MRA technique without contrast. COMPARISON:  CT 09/19/2016 FINDINGS: MRI HEAD FINDINGS Brain: Diffusion imaging does not show any acute or subacute infarction. The brainstem and cerebellum are normal. Cerebral hemispheres show moderate changes of chronic small-vessel disease of the deep white matter. No cortical or large vessel territory infarction. No mass lesion, hemorrhage, hydrocephalus or extra-axial collection. No pituitary mass. Vascular: Major vessels at the base of the brain show flow. Skull and upper cervical spine: Negative Sinuses/Orbits: Clear/normal Other: None MRA HEAD FINDINGS Both internal carotid arteries are widely patent into the brain. No siphon stenosis. The anterior and middle cerebral vessels are normal without proximal stenosis, aneurysm or vascular malformation. Both posterior cerebral arteries take a fetal origin from the anterior circulation. Both  vertebral arteries are patent with the left being dominant. Right vertebral artery terminates in PICA up. No basilar stenosis. Posterior circulation branch vessels are normal. Basilar artery is diminutive because of the fetal origin PCA anatomy. IMPRESSION: No acute or reversible finding. Moderate chronic small-vessel ischemic changes of the cerebral hemispheric deep white matter. No large vessel infarction either old or recent. Intracranial MR angiography normal evaluation of the large and medium size vessels . Electronically Signed   By: Nelson Chimes M.D.    On: 09/20/2016 06:38   Mr Jodene Nam Head/brain [id number] Cm  Result Date: 09/20/2016 CLINICAL DATA:  Speech disturbance. Difficulty with word finding. Frontal headache. EXAM: MRI HEAD WITHOUT CONTRAST MRA HEAD WITHOUT CONTRAST TECHNIQUE: Multiplanar, multiecho pulse sequences of the brain and surrounding structures were obtained without intravenous contrast. Angiographic images of the head were obtained using MRA technique without contrast. COMPARISON:  CT 09/19/2016 FINDINGS: MRI HEAD FINDINGS Brain: Diffusion imaging does not show any acute or subacute infarction. The brainstem and cerebellum are normal. Cerebral hemispheres show moderate changes of chronic small-vessel disease of the deep white matter. No cortical or large vessel territory infarction. No mass lesion, hemorrhage, hydrocephalus or extra-axial collection. No pituitary mass. Vascular: Major vessels at the base of the brain show flow. Skull and upper cervical spine: Negative Sinuses/Orbits: Clear/normal Other: None MRA HEAD FINDINGS Both internal carotid arteries are widely patent into the brain. No siphon stenosis. The anterior and middle cerebral vessels are normal without proximal stenosis, aneurysm or vascular malformation. Both posterior cerebral arteries take a fetal origin from the anterior circulation. Both vertebral arteries are patent with the left being dominant. Right vertebral artery terminates in PICA up. No basilar stenosis. Posterior circulation branch vessels are normal. Basilar artery is diminutive because of the fetal origin PCA anatomy. IMPRESSION: No acute or reversible finding. Moderate chronic small-vessel ischemic changes of the cerebral hemispheric deep white matter. No large vessel infarction either old or recent. Intracranial MR angiography normal evaluation of the large and medium size vessels . Electronically Signed   By: Nelson Chimes M.D.   On: 09/20/2016 06:38     Labs:   Basic Metabolic Panel:  Recent Labs Lab  09/19/16 1545  NA 138  K 4.2  CL 103  CO2 28  GLUCOSE 198*  BUN 33*  CREATININE 2.19*  CALCIUM 9.8   GFR Estimated Creatinine Clearance: 46.6 mL/min (by C-G formula based on SCr of 2.19 mg/dL (H)). Liver Function Tests:  Recent Labs Lab 09/19/16 1545  AST 34  ALT 32  ALKPHOS 41  BILITOT 0.7  PROT 7.4  ALBUMIN 3.8   No results for input(s): LIPASE, AMYLASE in the last 168 hours. No results for input(s): AMMONIA in the last 168 hours. Coagulation profile  Recent Labs Lab 09/19/16 1545 09/20/16 0339  INR 2.10 2.09    CBC:  Recent Labs Lab 09/19/16 1545  WBC 7.1  NEUTROABS 3.8  HGB 15.5  HCT 45.9  MCV 92.5  PLT 172   Cardiac Enzymes:  Recent Labs Lab 09/19/16 1545  TROPONINI <0.03   BNP: Invalid input(s): POCBNP CBG:  Recent Labs Lab 09/19/16 1612 09/19/16 2152 09/20/16 0633 09/20/16 1102  GLUCAP 240* 89 86 294*   D-Dimer No results for input(s): DDIMER in the last 72 hours. Hgb A1c No results for input(s): HGBA1C in the last 72 hours. Lipid Profile  Recent Labs  09/20/16 0339  CHOL 210*  HDL 40*  LDLCALC 114*  TRIG 282*  CHOLHDL 5.3   Thyroid  function studies No results for input(s): TSH, T4TOTAL, T3FREE, THYROIDAB in the last 72 hours.  Invalid input(s): FREET3 Anemia work up No results for input(s): VITAMINB12, FOLATE, FERRITIN, TIBC, IRON, RETICCTPCT in the last 72 hours. Microbiology No results found for this or any previous visit (from the past 240 hour(s)).   Discharge Instructions:   Discharge Instructions    Ambulatory referral to Neurology    Complete by:  As directed    An appointment is requested in approximately: 4-6 weeks re: TIA   Diet - low sodium heart healthy    Complete by:  As directed    Diet Carb Modified    Complete by:  As directed    Discharge instructions    Complete by:  As directed    INR goal 2.5-3 Wear CPAP nightly   Increase activity slowly    Complete by:  As directed       Allergies as of 09/20/2016      Reactions   Ivp Dye [iodinated Diagnostic Agents] Other (See Comments)   Cannot take due to kidney disease       Medication List    TAKE these medications   ALPRAZolam 0.5 MG tablet Commonly known as:  XANAX Take 0.5 mg by mouth at bedtime as needed for sleep.   atorvastatin 40 MG tablet Commonly known as:  LIPITOR Take 1 tablet (40 mg total) by mouth daily. Start taking on:  09/21/2016 What changed:  medication strength  how much to take   buPROPion 150 MG 24 hr tablet Commonly known as:  WELLBUTRIN XL Take 150 mg by mouth at bedtime.   diphenhydramine-acetaminophen 25-500 MG Tabs tablet Commonly known as:  TYLENOL PM Take 2 tablets by mouth at bedtime.   fenofibrate 160 MG tablet Take 160 mg by mouth daily.   Fish Oil 1000 MG Caps Take 2,000 mg by mouth daily.   gabapentin 300 MG capsule Commonly known as:  NEURONTIN Take 300 mg by mouth See admin instructions. Take 1 capsule (300 mg) by mouth daily at bedtime, may also take 1 capsule twice during the day as needed for foot pain   glipiZIDE 2.5 MG 24 hr tablet Commonly known as:  GLUCOTROL XL Take 2.5 mg by mouth See admin instructions. Take 1 tablet (2.5 mg) by mouth twice daily - with breakfast and lunch   HUMIRA PEN 40 MG/0.8ML Pnkt Generic drug:  Adalimumab Inject 40 mg into the skin every 14 (fourteen) days.   lisinopril 40 MG tablet Commonly known as:  PRINIVIL,ZESTRIL Take 40 mg by mouth daily.   multivitamin with minerals Tabs tablet Take 1 tablet by mouth daily.   pioglitazone 15 MG tablet Commonly known as:  ACTOS Take 15 mg by mouth at bedtime.   PSYLLIUM PO Take 4 capsules by mouth daily.   SODIUM BICARBONATE PO Take 2 tablets by mouth daily.   VICTOZA 18 MG/3ML Sopn Generic drug:  liraglutide Inject 1.2 mg into the skin daily.   warfarin 6 MG tablet Commonly known as:  COUMADIN Take 1 tablet (6 mg total) by mouth daily at 6 PM. What  changed:  how much to take  when to take this  additional instructions      Follow-up Information    JOBE,DANIEL B., MD Follow up in 1 week(s).   Specialty:  Internal Medicine Contact information: [id number] [place] [address] [phone]            Time coordinating discharge: 23  min  Signed:  Shoaib Siefker U Cal Gindlesperger   [facility] 09/20/2016, 1:56 PM

## 2016-09-20 NOTE — Care Management Note (Signed)
Case Management Note  Patient Details  Name: Chad Chen MRN: [id number] Date of Birth: 01/30/1951  Subjective/Objective:        Patient presented with Transient expressive aphasia.  Lives at home with his significant other. CM will follow for discharge needs pending patient's progress and physician orders.             Action/Plan:   Expected Discharge Date:                  Expected Discharge Plan:     In-House Referral:     Discharge planning Services     Post Acute Care Choice:    Choice offered to:     DME Arranged:    DME Agency:     HH Arranged:    HH Agency:     Status of Service:     If discussed at [employer] of Stay Meetings, dates discussed:    Additional Comments:  Rolm Baptise, RN 09/20/2016, 9:34 AM

## 2016-09-20 NOTE — Progress Notes (Addendum)
[place] for Warfarin Indication: h/o DVT, factor V Leiden  Allergies  Allergen Reactions  . Ivp Dye [Iodinated Diagnostic Agents] Other (See Comments)    Cannot take due to kidney disease     Patient Measurements: Height: 6' (182.9 cm) Weight: 291 lb 0.1 oz (132 kg) IBW/kg (Calculated) : 77.6  Vital Signs: Temp: 98.4 F (36.9 C) (02/19 0400) Temp Source: Oral (02/19 0400) BP: 175/93 (02/19 0630) Pulse Rate: 70 (02/19 0630)  Labs:  Recent Labs  09/19/16 1545 09/20/16 0339  HGB 15.5  --   HCT 45.9  --   PLT 172  --   APTT 44*  --   LABPROT 23.9* 23.8*  INR 2.10 2.09  CREATININE 2.19*  --   TROPONINI <0.03  --     Estimated Creatinine Clearance: 46.6 mL/min (by C-G formula based on SCr of 2.19 mg/dL (H)).   Medical History: Past Medical History:  Diagnosis Date  . Cancer ([place])   . Depression   . Diabetes mellitus without complication ([place])   . DVT (deep venous thrombosis) ([place])   . Factor V Leiden ([place])   . Hypertension   . MI (myocardial infarction)   . Renal disorder     Assessment: 66 year old male on Coumadin PTA for history of DVT and factor V Leiden. INR = 2.1 on admission, last dose 2/18 Dose PTA = 3 mg Thur/Sun, 6 mg other days  Goal of Therapy:  INR 2-3 Monitor platelets by anticoagulation protocol: Yes   Plan:  Warfarin 6mg  tonight x1 Daily INR  Andrey Cota. Diona Foley, PharmD, [place] Clinical Pharmacist [phone] 09/20/2016,10:08 AM

## 2016-09-21 LAB — HEMOGLOBIN A1C
HEMOGLOBIN A1C: 7.6 % — AB (ref 4.8–5.6)
MEAN PLASMA GLUCOSE: 171 mg/dL

## 2017-03-15 ENCOUNTER — Ambulatory Visit (INDEPENDENT_AMBULATORY_CARE_PROVIDER_SITE_OTHER): Payer: Medicare Other | Admitting: Podiatry

## 2017-03-15 ENCOUNTER — Encounter: Payer: Self-pay | Admitting: Podiatry

## 2017-03-15 VITALS — Ht 71.0 in | Wt 305.0 lb

## 2017-03-15 DIAGNOSIS — M79671 Pain in right foot: Secondary | ICD-10-CM

## 2017-03-15 DIAGNOSIS — M6700 Short Achilles tendon (acquired), unspecified ankle: Secondary | ICD-10-CM | POA: Diagnosis not present

## 2017-03-15 DIAGNOSIS — M7741 Metatarsalgia, right foot: Secondary | ICD-10-CM

## 2017-03-15 DIAGNOSIS — M7742 Metatarsalgia, left foot: Secondary | ICD-10-CM | POA: Diagnosis not present

## 2017-03-15 DIAGNOSIS — M79672 Pain in left foot: Secondary | ICD-10-CM | POA: Diagnosis not present

## 2017-03-15 NOTE — Progress Notes (Signed)
SUBJECTIVE: 66 y.o. year old male presents complaining of painful feet. Feet been hurting for over 12 years. PCP referred to see if there is anything can be done. Stated that his feet always pronated and wear out shoes on outside. Has had plantar fasciitis off and on for the past 20 years. The heel pain is bad now. Been to physical therapy for exercise. Barefoot walking on carpet is ok but in hard surface is bad to walk. On feet not much since retired, off and on total about one hour. DM duration of 10 years. Most recent blood glucose usually runs at 130 in the morning.  At this time he is taking physical therapy for stretch exercise.  REVIEW OF SYSTEMS: Arthritis in hands, back, shoulder, lower back, and feet. Psoriasis, DM, Chronic Kidney disease, Hx of Kidney and Colon ca in 2011. One kidney removed. Section of colon removed. HTN, High Cholesterol under controlled with medication. Has generalized fatigue. Difficulty exercising due to pain in both feet. Walking with shoes on is not bad if even surface. Uneven surface cause pain on feet. Used to swim in pool. Even walking round pool side is difficult without flipflaps. 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 Hx of DVT in 1994. Hx of stroke 6 month ago.   OBJECTIVE: DERMATOLOGIC EXAMINATION: No abnormal findings.  VASCULAR EXAMINATION OF LOWER LIMBS: All pedal pulses are palpable with normal pulsation.  Capillary Filling times within 3 seconds in all digits.  No edema or erythema noted. Temperature gradient from tibial crest to dorsum of foot is within normal bilateral.  NEUROLOGIC EXAMINATION OF THE LOWER LIMBS: Achilles DTR is present and within normal. Monofilament (Semmes-Weinstein 10-gm) sensory testing positive 6 out of 6, bilateral. Vibratory sensations(128Hz  turning fork) intact at medial and lateral forefoot bilateral.  Sharp and Dull discriminatory sensations at the plantar ball of hallux is intact  bilateral.   MUSCULOSKELETAL EXAMINATION: Positive for weak first ray with excess sagittal plane motion upon loading of forefoot bilateral. Stiffness in rearfoot and forefoot joints bilateral.  Tight Achilles tendon bilateral.  ASSESSMENT: Contracture Achilles tendon bilateral. Metatarsalgia bilateral, lesser. Elevated first ray bilateral. Hyperalgic foot bilateral.  PLAN: Reviewed clinical findings and available treatment options. Reviewed importance of stretch exercise.  Patient is to Continue current regimen at Physical therapy. May benefit from custom orthotics. Will use compound cream for pain control as needed.

## 2017-03-15 NOTE — Patient Instructions (Signed)
Seen for pain in both feet. Noted of tight Achilles tendon and stiffness all around rear foot and forefoot. Need stretch exercise. Continue current regimen at Physical therapy. Will use compound cream for pain control as needed.

## 2017-09-01 ENCOUNTER — Telehealth: Payer: Self-pay | Admitting: Neurology

## 2017-09-01 NOTE — Telephone Encounter (Signed)
Pt. Needs 12 week Botox apt.

## 2017-09-05 NOTE — Telephone Encounter (Signed)
I am not seeing anything about botox in his chart? Was it in the check out notes?

## 2017-09-06 ENCOUNTER — Encounter: Payer: Self-pay | Admitting: Neurology

## 2017-09-06 ENCOUNTER — Ambulatory Visit: Payer: Medicare Other | Admitting: Neurology

## 2017-09-06 VITALS — BP 161/95 | HR 92 | Ht 71.0 in | Wt 301.5 lb

## 2017-09-06 DIAGNOSIS — G3281 Cerebellar ataxia in diseases classified elsewhere: Secondary | ICD-10-CM | POA: Diagnosis not present

## 2017-09-06 DIAGNOSIS — F5101 Primary insomnia: Secondary | ICD-10-CM

## 2017-09-06 DIAGNOSIS — R413 Other amnesia: Secondary | ICD-10-CM

## 2017-09-06 DIAGNOSIS — D6851 Activated protein C resistance: Secondary | ICD-10-CM

## 2017-09-06 DIAGNOSIS — Z794 Long term (current) use of insulin: Secondary | ICD-10-CM

## 2017-09-06 DIAGNOSIS — I639 Cerebral infarction, unspecified: Secondary | ICD-10-CM | POA: Diagnosis not present

## 2017-09-06 DIAGNOSIS — E119 Type 2 diabetes mellitus without complications: Secondary | ICD-10-CM | POA: Diagnosis not present

## 2017-09-06 DIAGNOSIS — I10 Essential (primary) hypertension: Secondary | ICD-10-CM | POA: Diagnosis not present

## 2017-09-06 DIAGNOSIS — R2990 Unspecified symptoms and signs involving the nervous system: Secondary | ICD-10-CM

## 2017-09-06 NOTE — Progress Notes (Signed)
PATIENT: Chad Chen DOB: [birth year]  Chief Complaint  Patient presents with  . Dementia    MMSE 29/30 - 4 animals. He is here with his significant other, Almyra Free.  He has been having memory loss, word finding difficulty, personality changes, poor decision making skills and abnormal gait (shuffling steps) for the last year.  Reports having several falls.  Chad Chen Kitchen PCP    Cathlean Sauer, MD     HISTORICAL  Chad Chen is a 67 year old male, seen in refer by his primary care doctor Cathlean Sauer for evaluation of dementia, initial evaluation was on September 06, 2017.  He is accompanied by his significant other's Almyra Free.   He retired as International aid/development worker, always has mild balance issues, walks with a mild abnormal gait,  He was noted to have mild memory loss, progressive gait abnormality since 2018,  I was Chen to review hospital discharge on September 20, 2016, he has history of factor V Leyden mutation, history of DVT, on Coumadin, insulin-dependent diabetes, depression, hypertension, chronic kidney disease, remote renal and colorectal cancer, status post resection,  He was admitted to the hospital for sudden onset of word finding difficulties, suspicious for TIA, expressive aphasia, MRA of the brain was negative,  Echocardiogram showed ejection fraction 60-65%, wall motion was normal,  MRI brain in Feb 2018, showed no acute abnormality, moderate supratentorium chronic small vessel disease, MRA of the brain showed no large vessel disease.  CT head without contrast on July 29, 2017 showed no acute intracranial abnormality, confluent hypoattenuation throughout the bilateral periventricular white matter, likely due to small vessel disease,  CT cervical spine: September 29 2016, multilevel cervical spondylosis with mild to moderate spinal stenosis from C3-7  Lumbar x-ray in September 2018, mild disc osteophytic disease of lower lumbar spine, aortic atherosclerosis.  He retired at  age 67, Almyra Free know him for 5 years, he was noted to have gradual onset memory loss, searching for words, has become much less active, not motivated, making poor decisions while driving,  Recent few months, he also has urinary incontinence, wet his pants, occasionally bowel incident,   REVIEW OF SYSTEMS: Full 14 system review of systems performed and notable only for fatigue, swelling in legs, hearing loss, shortness of breath, cough, wheezing, incontinence, constipation, urination problem, incontinence, easy bruising, feeling cold, joint pain, cramps, aching muscles, runny nose, memory loss, effusion, headaches, weakness, dizziness, tremor  ALLERGIES: Allergies  Allergen Reactions  . Ivp Dye [Iodinated Diagnostic Agents] Other (See Comments)    Cannot take due to kidney disease   . Metrizamide Other (See Comments)    Cannot take due to kidney disease     HOME MEDICATIONS: Current Outpatient Medications  Medication Sig Dispense Refill  . Adalimumab (HUMIRA PEN) 40 MG/0.8ML PNKT Inject 40 mg into the skin every 14 (fourteen) days.    Chad Chen Kitchen aspirin 81 MG chewable tablet Chew 81 mg by mouth daily.    Chad Chen Kitchen atorvastatin (LIPITOR) 40 MG tablet Take 1 tablet (40 mg total) by mouth daily. 30 tablet 0  . diphenhydramine-acetaminophen (TYLENOL PM) 25-500 MG TABS tablet Take 1 tablet by mouth at bedtime.     Mariane Baumgarten Calcium (STOOL SOFTENER PO) Take 2 tablets by mouth daily.    . fenofibrate 160 MG tablet Take 160 mg by mouth daily.    Chad Chen Kitchen gabapentin (NEURONTIN) 300 MG capsule Take 300 mg by mouth See admin instructions. Take 1 capsule (300 mg) by mouth daily at bedtime, may also take 1 capsule twice during  the day as needed for foot pain    . glipiZIDE (GLUCOTROL XL) 2.5 MG 24 hr tablet Take 2.5 mg by mouth See admin instructions. Take 1 tablet (2.5 mg) by mouth twice daily - with breakfast and lunch    . liraglutide (VICTOZA) 18 MG/3ML SOPN Inject 1.2 mg into the skin daily.    Chad Chen Kitchen lisinopril  (PRINIVIL,ZESTRIL) 40 MG tablet Take 40 mg by mouth daily.    . Melatonin 10 MG CAPS Take 10 mg by mouth at bedtime.    . Multiple Vitamin (MULTIVITAMIN WITH MINERALS) TABS tablet Take 1 tablet by mouth daily.    . Omega-3 Fatty Acids (FISH OIL) 1000 MG CAPS Take 2,000 mg by mouth daily.    . pioglitazone (ACTOS) 15 MG tablet Take 15 mg by mouth at bedtime.    . PSYLLIUM PO Take 6 capsules by mouth daily.     . SODIUM BICARBONATE PO Take 2 tablets by mouth daily.    Chad Chen Kitchen UNABLE TO FIND Currently in study at [facility] for his stage 3 kidney failure (he is unsure if he is getting actual medication or a placebo). Unsure of the name of the potential drug.    . warfarin (COUMADIN) 6 MG tablet Take 1 tablet (6 mg total) by mouth daily at 6 PM.     No current facility-administered medications for this visit.     PAST MEDICAL HISTORY: Past Medical History:  Diagnosis Date  . Cancer ([place])    colon, kidney  . Depression   . Diabetes mellitus without complication ([place])   . DVT (deep venous thrombosis) ([place])   . Factor V Leiden ([place])   . Gait abnormality   . Hypertension   . Memory difficulty   . Psoriasis   . Renal disorder     PAST SURGICAL HISTORY: Past Surgical History:  Procedure Laterality Date  . NEPHRECTOMY Left     FAMILY HISTORY: Family History  Problem Relation Age of Onset  . Alzheimer's disease Mother   . Pneumonia Mother   . Emphysema Father   . Pancreatic cancer Maternal Grandfather     SOCIAL HISTORY:  Social History   Socioeconomic History  . Marital status: Divorced    Spouse name: Not on file  . Number of children: 2  . Years of education: 77  . Highest education level: Master's degree (e.g., MA, MS, MEng, MEd, MSW, MBA)  Social Needs  . Financial resource strain: Not on file  . Food insecurity - worry: Not on file  . Food insecurity - inability: Not on file  . Transportation needs - medical: Not on file  . Transportation needs - non-medical: Not  on file  Occupational History  . Occupation: Retired  Tobacco Use  . Smoking status: Former Research scientist (life sciences)  . Smokeless tobacco: Never Used  Substance and Sexual Activity  . Alcohol use: Yes    Comment: rare  . Drug use: No  . Sexual activity: Not on file  Other Topics Concern  . Not on file  Social History Narrative   Lives at home with his significant other.   Right-handed.   Occasional caffeine use.     PHYSICAL EXAM   Vitals:   09/06/17 1314  BP: (!) 161/95  Pulse: 92  Weight: (!) 301 lb 8 oz (136.8 kg)  Height: 5\' 11"  (1.803 m)    Not recorded      Body mass index is 42.05 kg/m.  PHYSICAL EXAMNIATION:  Gen: NAD, conversant, well nourised,  obese, well groomed                     Cardiovascular: Regular rate rhythm, no peripheral edema, warm, nontender. Eyes: Conjunctivae clear without exudates or hemorrhage Neck: Supple, no carotid bruits. Pulmonary: Clear to auscultation bilaterally   NEUROLOGICAL EXAM: MMSE - Mini Mental State Exam 09/06/2017  Orientation to time 4  Orientation to Place 5  Registration 3  Attention/ Calculation 5  Recall 3  Language- name 2 objects 2  Language- repeat 1  Language- follow 3 step command 3  Language- read & follow direction 1  Write a sentence 1  Copy design 1  Total score 29  animal naming 4   CRANIAL NERVES: CN II: Visual fields are full to confrontation. Fundoscopic exam is normal with sharp discs and no vascular changes. Pupils are round equal and briskly reactive to light. CN III, IV, VI: extraocular movement are normal. No ptosis. CN V: Facial sensation is intact to pinprick in all 3 divisions bilaterally. Corneal responses are intact.  CN VII: Face is symmetric with normal eye closure and smile. CN VIII: Hearing is normal to rubbing fingers CN IX, X: Palate elevates symmetrically. Phonation is normal. CN XI: Head turning and shoulder shrug are intact CN XII: Tongue is midline with normal movements and no  atrophy.  MOTOR: There is no pronator drift of out-stretched arms. Muscle bulk and tone are normal. Muscle strength is normal.  REFLEXES: Reflexes are 2+ and symmetric at the biceps, triceps, knees, and ankles. Plantar responses are flexor.  SENSORY: Length dependent decreased to light touch, pinprick, and vibratory sensation to toes COORDINATION: Rapid alternating movements and fine finger movements are intact. There is no dysmetria on finger-to-nose and heel-knee-shin.    GAIT/STANCE:  He needs push up to get up from seated position, unsteady, wide-based   DIAGNOSTIC DATA (LABS, IMAGING, TESTING) - I reviewed patient records, labs, notes, testing and imaging myself where available.   ASSESSMENT AND PLAN  Ireland Chagnon is a 67 y.o. male   Progressive worsening memory loss, gait abnormality, brisk reflexes,  Differentiation diagnosis include central nervous system degenerative disorder, deconditioning, obesity, along with baseline gait abnormality.  Proceed with MRI of the brain, cervical spine, lumbar spine,  Referral to physical therapy  Laboratory evaluations for etiology  Marcial Pacas, M.D. Ph.D.  [facility] [address] [place], [place] [address] Ph: [phone] Fax: [phone]  CC: Cathlean Sauer, MD

## 2017-09-07 LAB — CBC WITH DIFFERENTIAL/PLATELET
BASOS ABS: 0 10*3/uL (ref 0.0–0.2)
Basos: 0 %
EOS (ABSOLUTE): 0.1 10*3/uL (ref 0.0–0.4)
Eos: 1 %
Hematocrit: 43.3 % (ref 37.5–51.0)
Hemoglobin: 14.5 g/dL (ref 13.0–17.7)
IMMATURE GRANS (ABS): 0 10*3/uL (ref 0.0–0.1)
IMMATURE GRANULOCYTES: 0 %
LYMPHS: 36 %
Lymphocytes Absolute: 2.4 10*3/uL (ref 0.7–3.1)
MCH: 31 pg (ref 26.6–33.0)
MCHC: 33.5 g/dL (ref 31.5–35.7)
MCV: 93 fL (ref 79–97)
Monocytes Absolute: 0.3 10*3/uL (ref 0.1–0.9)
Monocytes: 5 %
NEUTROS PCT: 58 %
Neutrophils Absolute: 3.7 10*3/uL (ref 1.4–7.0)
PLATELETS: 191 10*3/uL (ref 150–379)
RBC: 4.68 x10E6/uL (ref 4.14–5.80)
RDW: 14.6 % (ref 12.3–15.4)
WBC: 6.5 10*3/uL (ref 3.4–10.8)

## 2017-09-07 LAB — TSH: TSH: 2.54 u[IU]/mL (ref 0.450–4.500)

## 2017-09-07 LAB — COMPREHENSIVE METABOLIC PANEL
ALT: 24 IU/L (ref 0–44)
AST: 29 IU/L (ref 0–40)
Albumin/Globulin Ratio: 1.6 (ref 1.2–2.2)
Albumin: 3.8 g/dL (ref 3.6–4.8)
Alkaline Phosphatase: 57 IU/L (ref 39–117)
BILIRUBIN TOTAL: 0.3 mg/dL (ref 0.0–1.2)
BUN/Creatinine Ratio: 13 (ref 10–24)
BUN: 22 mg/dL (ref 8–27)
CALCIUM: 9.4 mg/dL (ref 8.6–10.2)
CHLORIDE: 104 mmol/L (ref 96–106)
CO2: 21 mmol/L (ref 20–29)
Creatinine, Ser: 1.76 mg/dL — ABNORMAL HIGH (ref 0.76–1.27)
GFR calc non Af Amer: 39 mL/min/{1.73_m2} — ABNORMAL LOW (ref >59)
GFR, EST AFRICAN AMERICAN: 45 mL/min/{1.73_m2} — AB (ref >59)
GLUCOSE: 248 mg/dL — AB (ref 65–99)
Globulin, Total: 2.4 g/dL (ref 1.5–4.5)
Potassium: 4.6 mmol/L (ref 3.5–5.2)
Sodium: 142 mmol/L (ref 134–144)
TOTAL PROTEIN: 6.2 g/dL (ref 6.0–8.5)

## 2017-09-07 LAB — RPR: RPR: NONREACTIVE

## 2017-09-07 LAB — VITAMIN B12: Vitamin B-12: 615 pg/mL (ref 232–1245)

## 2017-09-14 ENCOUNTER — Telehealth: Payer: Self-pay | Admitting: Neurology

## 2017-09-14 NOTE — Telephone Encounter (Signed)
Patient requested to have his MRi's at [facility]. Faxed order they will contact the patient to schedule.

## 2017-09-19 ENCOUNTER — Ambulatory Visit: Payer: Medicare Other | Attending: Neurology | Admitting: Physical Therapy

## 2017-10-04 ENCOUNTER — Other Ambulatory Visit: Payer: Self-pay

## 2017-10-04 ENCOUNTER — Encounter (HOSPITAL_BASED_OUTPATIENT_CLINIC_OR_DEPARTMENT_OTHER): Payer: Self-pay

## 2017-10-04 ENCOUNTER — Emergency Department (HOSPITAL_BASED_OUTPATIENT_CLINIC_OR_DEPARTMENT_OTHER)
Admission: EM | Admit: 2017-10-04 | Discharge: 2017-10-04 | Disposition: A | Discharge status: Home / Self Care | Payer: Medicare Other | Attending: Emergency Medicine | Admitting: Emergency Medicine

## 2017-10-04 ENCOUNTER — Emergency Department (HOSPITAL_BASED_OUTPATIENT_CLINIC_OR_DEPARTMENT_OTHER): Payer: Medicare Other

## 2017-10-04 DIAGNOSIS — Z794 Long term (current) use of insulin: Secondary | ICD-10-CM | POA: Diagnosis not present

## 2017-10-04 DIAGNOSIS — Z7982 Long term (current) use of aspirin: Secondary | ICD-10-CM | POA: Diagnosis not present

## 2017-10-04 DIAGNOSIS — Y998 Other external cause status: Secondary | ICD-10-CM | POA: Diagnosis not present

## 2017-10-04 DIAGNOSIS — N183 Chronic kidney disease, stage 3 (moderate): Secondary | ICD-10-CM | POA: Insufficient documentation

## 2017-10-04 DIAGNOSIS — W01198A Fall on same level from slipping, tripping and stumbling with subsequent striking against other object, initial encounter: Secondary | ICD-10-CM | POA: Diagnosis not present

## 2017-10-04 DIAGNOSIS — Y93H9 Activity, other involving exterior property and land maintenance, building and construction: Secondary | ICD-10-CM | POA: Insufficient documentation

## 2017-10-04 DIAGNOSIS — Y92008 Other place in unspecified non-institutional (private) residence as the place of occurrence of the external cause: Secondary | ICD-10-CM | POA: Insufficient documentation

## 2017-10-04 DIAGNOSIS — S0990XA Unspecified injury of head, initial encounter: Secondary | ICD-10-CM | POA: Diagnosis present

## 2017-10-04 DIAGNOSIS — Z87891 Personal history of nicotine dependence: Secondary | ICD-10-CM | POA: Insufficient documentation

## 2017-10-04 DIAGNOSIS — S0081XA Abrasion of other part of head, initial encounter: Secondary | ICD-10-CM | POA: Insufficient documentation

## 2017-10-04 DIAGNOSIS — Z7901 Long term (current) use of anticoagulants: Secondary | ICD-10-CM | POA: Insufficient documentation

## 2017-10-04 DIAGNOSIS — I129 Hypertensive chronic kidney disease with stage 1 through stage 4 chronic kidney disease, or unspecified chronic kidney disease: Secondary | ICD-10-CM | POA: Insufficient documentation

## 2017-10-04 DIAGNOSIS — Z23 Encounter for immunization: Secondary | ICD-10-CM | POA: Diagnosis not present

## 2017-10-04 DIAGNOSIS — W19XXXA Unspecified fall, initial encounter: Secondary | ICD-10-CM

## 2017-10-04 DIAGNOSIS — E1122 Type 2 diabetes mellitus with diabetic chronic kidney disease: Secondary | ICD-10-CM | POA: Insufficient documentation

## 2017-10-04 MED ORDER — TETANUS-DIPHTH-ACELL PERTUSSIS 5-2.5-18.5 LF-MCG/0.5 IM SUSP
0.5000 mL | Freq: Once | INTRAMUSCULAR | Status: AC
  Administered 2017-10-04: 0.5 mL via INTRAMUSCULAR
  Filled 2017-10-04: qty 0.5

## 2017-10-04 NOTE — ED Notes (Signed)
ED Provider at bedside. 

## 2017-10-04 NOTE — ED Triage Notes (Addendum)
Pt fell on hill in driveway. Pt has significant abrasion to L face, minor abrasion to L hand, L knee pain.  Pt endorses being on blood thinners.

## 2017-10-04 NOTE — Discharge Instructions (Signed)
CT scan reassuring. No fracture of the face. No head bleed.  Please gently wash face with warm soapy water daily.   Your tetanus vaccine was updated.   You can take tylenol for any headache you have.   Follow up with your regular doctor for recheck after your ER visit today.   Return to the Emergency Department if you have worsening headache, vomiting, trouble with your vision or any new or concerning symptoms.

## 2017-10-04 NOTE — ED Provider Notes (Signed)
[facility] EMERGENCY DEPARTMENT Provider Note   CSN: [id number] Arrival date & time: 10/04/17  1434     History   Chief Complaint Chief Complaint  Patient presents with  . Fall    HPI Chad Chen is a 67 y.o. male.  HPI   Chad Chen is a 67 year old male with a history of factor V Leiden (on Coumadin), memory loss, gait abnormality, type 2 diabetes, depression who presents to the emergency department for evaluation after a mechanical fall earlier today.  Patient states that he was taking trash out in his driveway when he lost his footing and fell forward.  States that he hit the left side of his head with the fall and has an abrasion over his left temple.  Denies loss of consciousness.  He was unable to stand due to feeling weak, which prompted his wife to call EMS. States that he now has a 10 severity dull and aching left-sided headache.  He took two Tylenol prior to coming to the emergency department which helped some. States that he also scraped his left shoulder, hand and knee with the fall, but denies pain.  Has a walker at home, but does not regularly use it.  Denies numbness, weakness, vision loss, nausea/vomiting, back pain, chest pain, shortness of breath, abdominal pain.  He is unsure of his last tetanus vaccination.  Per chart review, patient is being seen by neurology (Dr. Krista Blue) for progressive memory loss, gait abnormality and brisk reflexes.  MRI of brain, cervical spine, lumbar spine has been ordered but no results are in.  Past Medical History:  Diagnosis Date  . Cancer ([place])    colon, kidney  . Depression   . Diabetes mellitus without complication ([place])   . DVT (deep venous thrombosis) ([place])   . Factor V Leiden ([place])   . Gait abnormality   . Hypertension   . Memory difficulty   . Psoriasis   . Renal disorder     Patient Active Problem List   Diagnosis Date Noted  . Expressive aphasia   . Stroke-like episode ([place]) 09/19/2016  . Depression  09/19/2016  . Insomnia 09/19/2016  . Diabetes mellitus, type II, insulin dependent ([place]) 09/19/2016  . Factor V Leiden mutation ([place]) 09/19/2016  . History of DVT (deep vein thrombosis) 09/19/2016  . CKD (chronic kidney disease), stage III ([place]) 09/19/2016  . AKI (acute kidney injury) ([place]) 09/19/2016  . Essential hypertension 09/19/2016    Past Surgical History:  Procedure Laterality Date  . NEPHRECTOMY Left        Home Medications    Prior to Admission medications   Medication Sig Start Date End Date Taking? Authorizing Provider  Adalimumab (HUMIRA PEN) 40 MG/0.8ML PNKT Inject 40 mg into the skin every 14 (fourteen) days.    [provider]  aspirin 81 MG chewable tablet Chew 81 mg by mouth daily.    [provider]  atorvastatin (LIPITOR) 40 MG tablet Take 1 tablet (40 mg total) by mouth daily. 09/21/16   Geradine Girt, DO  diphenhydramine-acetaminophen (TYLENOL PM) 25-500 MG TABS tablet Take 1 tablet by mouth at bedtime.     [provider]  Docusate Calcium (STOOL SOFTENER PO) Take 2 tablets by mouth daily.    [provider]  fenofibrate 160 MG tablet Take 160 mg by mouth daily.    [provider]  gabapentin (NEURONTIN) 300 MG capsule Take 300 mg by mouth See admin instructions. Take 1 capsule (300 mg) by  mouth daily at bedtime, may also take 1 capsule twice during the day as needed for foot pain    [provider]  glipiZIDE (GLUCOTROL XL) 2.5 MG 24 hr tablet Take 2.5 mg by mouth See admin instructions. Take 1 tablet (2.5 mg) by mouth twice daily - with breakfast and lunch    [provider]  liraglutide (VICTOZA) 18 MG/3ML SOPN Inject 1.2 mg into the skin daily.    [provider]  lisinopril (PRINIVIL,ZESTRIL) 40 MG tablet Take 40 mg by mouth daily.    [provider]  Melatonin 10 MG CAPS Take 10 mg by mouth at bedtime.    [provider]  Multiple Vitamin (MULTIVITAMIN WITH  MINERALS) TABS tablet Take 1 tablet by mouth daily.    [provider]  Omega-3 Fatty Acids (FISH OIL) 1000 MG CAPS Take 2,000 mg by mouth daily.    [provider]  pioglitazone (ACTOS) 15 MG tablet Take 15 mg by mouth at bedtime.    [provider]  PSYLLIUM PO Take 6 capsules by mouth daily.     [provider]  SODIUM BICARBONATE PO Take 2 tablets by mouth daily.    [provider]  UNABLE TO FIND Currently in study at [facility] for his stage 3 kidney failure (he is unsure if he is getting actual medication or a placebo). Unsure of the name of the potential drug.    [provider]  warfarin (COUMADIN) 6 MG tablet Take 1 tablet (6 mg total) by mouth daily at 6 PM. 09/20/16   Geradine Girt, DO    Family History Family History  Problem Relation Age of Onset  . Alzheimer's disease Mother   . Pneumonia Mother   . Emphysema Father   . Pancreatic cancer Maternal Grandfather     Social History Social History   Tobacco Use  . Smoking status: Former Research scientist (life sciences)  . Smokeless tobacco: Never Used  Substance Use Topics  . Alcohol use: Yes    Comment: rare  . Drug use: No     Allergies   Ivp dye [iodinated diagnostic agents] and Metrizamide   Review of Systems Review of Systems  Constitutional: Negative for chills and fever.  HENT: Negative for facial swelling, nosebleeds and trouble swallowing.   Eyes: Negative for visual disturbance.  Respiratory: Negative for shortness of breath.   Cardiovascular: Negative for chest pain.  Gastrointestinal: Negative for abdominal pain, nausea and vomiting.  Genitourinary: Negative for dysuria.  Musculoskeletal: Positive for gait problem (difficulty with balance, ongoing for a year). Negative for neck pain.  Skin: Positive for wound (abrasion over left temple, hand and knee).  Neurological: Positive for headaches. Negative for dizziness, seizures, syncope, speech difficulty, weakness,  light-headedness and numbness.  Psychiatric/Behavioral: Negative for agitation.     Physical Exam Updated Vital Signs BP (!) 151/96 (BP Location: Left Arm)   Pulse 87   Temp 97.8 F (36.6 C) (Oral)   Resp 20   Ht 5\' 11"  (1.803 m)   Wt (!) 140.6 kg (310 lb)   SpO2 93%   BMI 43.24 kg/m   Physical Exam  Constitutional: He is oriented to person, place, and time. He appears well-developed and well-nourished. No distress.  HENT:  Head: Normocephalic and atraumatic.  Mouth/Throat: Oropharynx is clear and moist. No oropharyngeal exudate.  Tenderness over the left inferior orbit. Abrasion over the left temple, no active bleeding. Tender to the touch.   Eyes: Conjunctivae are normal. Pupils are  equal, round, and reactive to light. Right eye exhibits no discharge. Left eye exhibits no discharge.  Neck: Normal range of motion. Neck supple.  No midline cervical spine tenderness  Cardiovascular: Normal rate and regular rhythm. Exam reveals no friction rub.  No murmur heard. Pulmonary/Chest: Effort normal and breath sounds normal. No stridor. No respiratory distress. He has no wheezes. He has no rales.  Abdominal: Soft. Bowel sounds are normal. There is no tenderness. There is no guarding.  Musculoskeletal: Normal range of motion.  Left knee nontender. Full ROM. No joint line tenderness. No joint effusion or swelling. No abnormal alignment or patellar mobility. No bruising, erythema or warmth overlaying the joint. No varus/valgus laxity. Negative drawer's and McMurray's.  Left shoulder non-tender to palpation. Full ROM. Negative empty can test, negative Neer's. No swelling, erythema or ecchymosis present. No step-off, crepitus, or deformity appreciated.  Left hand with small abrasions noted at the dorsal aspect of the hand. No active bleeding. No overlying erythema, induration or warmth. Hand and wrist non-tender to palpation. No swelling. Full ROM without pain. There is no anatomic snuff box  tenderness. Normal sensation and motor function in the median, ulnar, and radial nerve.  Neurological: He is alert and oriented to person, place, and time. Coordination normal.  Mental Status:  Alert, oriented, thought content appropriate, able to give a coherent history. Speech fluent without evidence of aphasia. Able to follow 2 step commands without difficulty.  Cranial Nerves:  II:  Peripheral visual fields grossly normal, pupils equal, round, reactive to light III,IV, VI: ptosis not present, extra-ocular motions intact bilaterally  V,VII: smile symmetric, facial light touch sensation equal VIII: hearing grossly normal to voice  X: uvula elevates symmetrically  XI: bilateral shoulder shrug symmetric and strong XII: midline tongue extension without fassiculations Motor:  Normal tone. 5/5 in upper and lower extremities bilaterally including strong and equal grip strength and dorsiflexion/plantar flexion Sensory: Pinprick and light touch normal in all extremities.  Deep Tendon Reflexes: 2+ and symmetric in the biceps and patella Cerebellar: normal finger-to-nose with bilateral upper extremities Gait: Patient able to ambulate independently. Appears somewhat unsteady. Steps are short and slow. Good foot clearance.  CV: Bilateral radial pulse 2+ and DP pulses 2+  Skin: Skin is warm and dry. Capillary refill takes less than 2 seconds. He is not diaphoretic.  Psychiatric: He has a normal mood and affect. His behavior is normal.  Nursing note and vitals reviewed.    ED Treatments / Results  Labs (all labs ordered are listed, but only abnormal results are displayed) Labs Reviewed - No data to display  EKG  EKG Interpretation None       Radiology Ct Head Wo Contrast  Result Date: 10/04/2017 CLINICAL DATA:  67 year old male status post fall today with left face abrasions. Head and neck pain. EXAM: CT HEAD WITHOUT CONTRAST CT MAXILLOFACIAL WITHOUT CONTRAST CT CERVICAL SPINE WITHOUT  CONTRAST TECHNIQUE: Multidetector CT imaging of the head, cervical spine, and maxillofacial structures were performed using the standard protocol without intravenous contrast. Multiplanar CT image reconstructions of the cervical spine and maxillofacial structures were also generated. COMPARISON:  Head CT without contrast 09/19/2016. Brain MRI 09/20/2016. FINDINGS: CT HEAD FINDINGS Brain: Stable cerebral volume. Stable gray-white matter differentiation throughout the brain. Patchy bilateral periventricular and other cerebral white matter hypodensity, most pronounced in the occipital lobes. No midline shift, ventriculomegaly, mass effect, evidence of mass lesion, intracranial hemorrhage or evidence of cortically based acute infarction. No cortical encephalomalacia identified. Vascular: Calcified atherosclerosis  at the skull base. No suspicious intracranial vascular hyperdensity. Skull: Stable.  No skull fracture. Other: No scalp hematoma or soft tissue gas identified. CT MAXILLOFACIAL FINDINGS Osseous: Mandible intact. Maxilla intact. No zygoma fracture. No nasal bone fracture. Intact central skull base. Orbits: Intact bilateral orbital walls. Bilateral globes and other orbits soft tissues appear normal. Sinuses: Paranasal sinuses and mastoids are stable and well pneumatized. Tympanic cavities are clear. Soft tissues: Negative visible noncontrast larynx, pharynx, parapharyngeal spaces, retropharyngeal space, sublingual space, submandibular spaces and parotid spaces. No upper cervical lymphadenopathy. Contusion and/or hematoma to the left anterior face just below the left zygomatic arch (series 6, image 41. No subcutaneous gas identified. No other superficial soft tissue injury identified. CT CERVICAL SPINE FINDINGS Alignment: Straightening of cervical lordosis. Bilateral posterior element alignment is within normal limits. Cervicothoracic junction alignment is within normal limits. Skull base and vertebrae: Visualized  skull base is intact. No atlanto-occipital dissociation. Decreased bone detail at the cervicothoracic junction appears related to quantum mottle artifact. No cervical spine fracture identified. Soft tissues and spinal canal: No prevertebral fluid or swelling. No visible canal hematoma. Negative noncontrast neck soft tissues aside from bilateral carotid bifurcation calcified atherosclerosis. Disc levels: Severe disc space loss with bulky endplate spurring B9-T9 through C6-C7. Suspect mild associated degenerative spinal stenosis most pronounced at C5-C6. Upper chest: Grossly intact visible upper thoracic levels. Negative lung apices. Negative noncontrast superior mediastinum. IMPRESSION: 1. Left face soft tissue injury without associated face or skull fracture. 2.  No acute intracranial abnormality identified. 3. No acute fracture in the cervical spine. Widespread advanced cervical spine disc and endplate degeneration with suspected degenerative spinal stenosis maximal at C5-C6. Electronically Signed   By: Genevie Ann M.D.   On: 10/04/2017 16:11   Ct Cervical Spine Wo Contrast  Result Date: 10/04/2017 CLINICAL DATA:  67 year old male status post fall today with left face abrasions. Head and neck pain. EXAM: CT HEAD WITHOUT CONTRAST CT MAXILLOFACIAL WITHOUT CONTRAST CT CERVICAL SPINE WITHOUT CONTRAST TECHNIQUE: Multidetector CT imaging of the head, cervical spine, and maxillofacial structures were performed using the standard protocol without intravenous contrast. Multiplanar CT image reconstructions of the cervical spine and maxillofacial structures were also generated. COMPARISON:  Head CT without contrast 09/19/2016. Brain MRI 09/20/2016. FINDINGS: CT HEAD FINDINGS Brain: Stable cerebral volume. Stable gray-white matter differentiation throughout the brain. Patchy bilateral periventricular and other cerebral white matter hypodensity, most pronounced in the occipital lobes. No midline shift, ventriculomegaly, mass  effect, evidence of mass lesion, intracranial hemorrhage or evidence of cortically based acute infarction. No cortical encephalomalacia identified. Vascular: Calcified atherosclerosis at the skull base. No suspicious intracranial vascular hyperdensity. Skull: Stable.  No skull fracture. Other: No scalp hematoma or soft tissue gas identified. CT MAXILLOFACIAL FINDINGS Osseous: Mandible intact. Maxilla intact. No zygoma fracture. No nasal bone fracture. Intact central skull base. Orbits: Intact bilateral orbital walls. Bilateral globes and other orbits soft tissues appear normal. Sinuses: Paranasal sinuses and mastoids are stable and well pneumatized. Tympanic cavities are clear. Soft tissues: Negative visible noncontrast larynx, pharynx, parapharyngeal spaces, retropharyngeal space, sublingual space, submandibular spaces and parotid spaces. No upper cervical lymphadenopathy. Contusion and/or hematoma to the left anterior face just below the left zygomatic arch (series 6, image 41. No subcutaneous gas identified. No other superficial soft tissue injury identified. CT CERVICAL SPINE FINDINGS Alignment: Straightening of cervical lordosis. Bilateral posterior element alignment is within normal limits. Cervicothoracic junction alignment is within normal limits. Skull base and vertebrae: Visualized skull base is intact. No atlanto-occipital  dissociation. Decreased bone detail at the cervicothoracic junction appears related to quantum mottle artifact. No cervical spine fracture identified. Soft tissues and spinal canal: No prevertebral fluid or swelling. No visible canal hematoma. Negative noncontrast neck soft tissues aside from bilateral carotid bifurcation calcified atherosclerosis. Disc levels: Severe disc space loss with bulky endplate spurring Z6-X0 through C6-C7. Suspect mild associated degenerative spinal stenosis most pronounced at C5-C6. Upper chest: Grossly intact visible upper thoracic levels. Negative lung  apices. Negative noncontrast superior mediastinum. IMPRESSION: 1. Left face soft tissue injury without associated face or skull fracture. 2.  No acute intracranial abnormality identified. 3. No acute fracture in the cervical spine. Widespread advanced cervical spine disc and endplate degeneration with suspected degenerative spinal stenosis maximal at C5-C6. Electronically Signed   By: Genevie Ann M.D.   On: 10/04/2017 16:11   Ct Maxillofacial Wo Contrast  Result Date: 10/04/2017 CLINICAL DATA:  67 year old male status post fall today with left face abrasions. Head and neck pain. EXAM: CT HEAD WITHOUT CONTRAST CT MAXILLOFACIAL WITHOUT CONTRAST CT CERVICAL SPINE WITHOUT CONTRAST TECHNIQUE: Multidetector CT imaging of the head, cervical spine, and maxillofacial structures were performed using the standard protocol without intravenous contrast. Multiplanar CT image reconstructions of the cervical spine and maxillofacial structures were also generated. COMPARISON:  Head CT without contrast 09/19/2016. Brain MRI 09/20/2016. FINDINGS: CT HEAD FINDINGS Brain: Stable cerebral volume. Stable gray-white matter differentiation throughout the brain. Patchy bilateral periventricular and other cerebral white matter hypodensity, most pronounced in the occipital lobes. No midline shift, ventriculomegaly, mass effect, evidence of mass lesion, intracranial hemorrhage or evidence of cortically based acute infarction. No cortical encephalomalacia identified. Vascular: Calcified atherosclerosis at the skull base. No suspicious intracranial vascular hyperdensity. Skull: Stable.  No skull fracture. Other: No scalp hematoma or soft tissue gas identified. CT MAXILLOFACIAL FINDINGS Osseous: Mandible intact. Maxilla intact. No zygoma fracture. No nasal bone fracture. Intact central skull base. Orbits: Intact bilateral orbital walls. Bilateral globes and other orbits soft tissues appear normal. Sinuses: Paranasal sinuses and mastoids are stable  and well pneumatized. Tympanic cavities are clear. Soft tissues: Negative visible noncontrast larynx, pharynx, parapharyngeal spaces, retropharyngeal space, sublingual space, submandibular spaces and parotid spaces. No upper cervical lymphadenopathy. Contusion and/or hematoma to the left anterior face just below the left zygomatic arch (series 6, image 41. No subcutaneous gas identified. No other superficial soft tissue injury identified. CT CERVICAL SPINE FINDINGS Alignment: Straightening of cervical lordosis. Bilateral posterior element alignment is within normal limits. Cervicothoracic junction alignment is within normal limits. Skull base and vertebrae: Visualized skull base is intact. No atlanto-occipital dissociation. Decreased bone detail at the cervicothoracic junction appears related to quantum mottle artifact. No cervical spine fracture identified. Soft tissues and spinal canal: No prevertebral fluid or swelling. No visible canal hematoma. Negative noncontrast neck soft tissues aside from bilateral carotid bifurcation calcified atherosclerosis. Disc levels: Severe disc space loss with bulky endplate spurring R6-E4 through C6-C7. Suspect mild associated degenerative spinal stenosis most pronounced at C5-C6. Upper chest: Grossly intact visible upper thoracic levels. Negative lung apices. Negative noncontrast superior mediastinum. IMPRESSION: 1. Left face soft tissue injury without associated face or skull fracture. 2.  No acute intracranial abnormality identified. 3. No acute fracture in the cervical spine. Widespread advanced cervical spine disc and endplate degeneration with suspected degenerative spinal stenosis maximal at C5-C6. Electronically Signed   By: Genevie Ann M.D.   On: 10/04/2017 16:11    Procedures Procedures (including critical care time)  Medications Ordered in ED Medications  Tdap (  BOOSTRIX) injection 0.5 mL (0.5 mLs Intramuscular Given 10/04/17 1536)     Initial Impression /  Assessment and Plan / ED Course  I have reviewed the triage vital signs and the nursing notes.  Pertinent labs & imaging results that were available during my care of the patient were reviewed by me and considered in my medical decision making (see chart for details).    Patient with a history of chronic memory loss, gait abnormality and weakness presents to the emergency department after sustaining a mechanical fall in his driveway earlier today. He is on Coumadin. Of note, he was not using his walker.  He is currently being seen by Neurologist Dr. Krista Blue.  On exam, patient VSS and in no acute distress. He has an abrasion over his left forehead, no active bleeding. No erythema, warmth or signs of infection. He has no focal neurological deficits on exam. He is able to ambulate independently, although his gait is somewhat unbalanced with short and slow steps. Wife at bedside states that this is baseline.   Will proceed with CT maxillofacial, head and cervical spine for further evaluation given he is on a blood thinner and has facial pain. No tenderness over his left hand, shoulder or knee. He has full ROM. He is neurovascularly intact. Doubt acute fracture in hand, shoulder or knee. Engaged in shared decision making with patient and his wife who agree that no xray of shoulder, hand or knee is indicated at this time. Discussed plan with Dr. Vallery Ridge who also saw the patient and agrees.   CT head without intracranial abnormality. CT maxillofacial without skull or facial fracture. CT cervical spine without fracture. Patient's Tdap was updated in the emergency department. On recheck, patient has no complaints. Cleaned the abrasion on his head with sterile saline. Counseled patient to gently wash with warm soapy water daily and to return if signs of infection.   Counseled him to schedule an appointment with his PCP for recheck of his ER visit today and ongoing trouble with gait. Counseled patient to use his  walker. Patient agrees and voices understanding to the above plan. Discharge to home.    Final Clinical Impressions(s) / ED Diagnoses   Final diagnoses:  Fall, initial encounter    ED Discharge Orders    None       Bernarda Caffey 10/04/17 1754    Charlesetta Shanks, MD 10/07/17 [phone]

## 2017-10-04 NOTE — ED Provider Notes (Signed)
Medical screening examination/treatment/procedure(s) were conducted as a shared visit with non-physician practitioner(s) and myself.  I personally evaluated the patient during the encounter.   EKG Interpretation None       Patient describes falling in his driveway.  He had loss of balance.  Patient did strike his head and face.  He denies loss of consciousness.  Patient is anticoagulated.  Patient is alert with clear mental status.  He is ambulatory with steady gait.  Positive for facial abrasions on the left zygoma.  Extraocular motion is normal with pupils symmetric and responsive.  Speech is clear.  Mild tenderness of cervical spine.  Normal symmetric use of the extremities.  Due to patient being anticoagulated I agree with proceeding with CT evaluation.  I agree with plan of management.   Charlesetta Shanks, MD 10/04/17 [phone]

## 2017-11-14 ENCOUNTER — Encounter: Payer: Self-pay | Admitting: Neurology

## 2017-11-14 ENCOUNTER — Ambulatory Visit (INDEPENDENT_AMBULATORY_CARE_PROVIDER_SITE_OTHER): Payer: Medicare Other | Admitting: Neurology

## 2017-11-14 VITALS — BP 167/94 | HR 79 | Ht 71.0 in | Wt 297.0 lb

## 2017-11-14 DIAGNOSIS — F329 Major depressive disorder, single episode, unspecified: Secondary | ICD-10-CM

## 2017-11-14 DIAGNOSIS — G3281 Cerebellar ataxia in diseases classified elsewhere: Secondary | ICD-10-CM

## 2017-11-14 DIAGNOSIS — F32A Depression, unspecified: Secondary | ICD-10-CM

## 2017-11-14 DIAGNOSIS — R269 Unspecified abnormalities of gait and mobility: Secondary | ICD-10-CM

## 2017-11-14 NOTE — Progress Notes (Signed)
PATIENT: Chad Chen DOB: 22-Sep-1950  Chief Complaint  Patient presents with  . Memory Loss    He is here with his significant other, Chad Chen.  They would like to review his MRI brain.  His MMSE on 09/06/17 was 29/30.  . Gait Abnormality    They would like to review his MRI cervial and thoracic results.  He uses a cane to help with walking.  Reports one fall since last seen.     HISTORICAL  Chad Chen is a 67 year old male, seen in refer by his primary care doctor Chad Chen for evaluation of dementia, initial evaluation was on September 06, 2017.  He is accompanied by his significant other's Chad Chen.   He retired as International aid/development worker, always has mild balance issues, walks with a mild abnormal gait,  He was noted to have mild memory loss, progressive gait abnormality since 2018,  I was able to review hospital discharge on September 20, 2016, he has history of factor V Leyden mutation, history of DVT, on Coumadin, insulin-dependent diabetes, depression, hypertension, chronic kidney disease, remote renal and colorectal cancer, status post resection,  He was admitted to the hospital for sudden onset of word finding difficulties, suspicious for TIA, expressive aphasia, MRA of the brain was negative,  Echocardiogram showed ejection fraction 60-65%, wall motion was normal,  MRI brain in Feb 2018, showed no acute abnormality, moderate supratentorium chronic small vessel disease, MRA of the brain showed no large vessel disease.  CT head without contrast on July 29, 2017 showed no acute intracranial abnormality, confluent hypoattenuation throughout the bilateral periventricular white matter, likely due to small vessel disease,  CT cervical spine: September 29 2016, multilevel cervical spondylosis with mild to moderate spinal stenosis from C3-7  Lumbar x-ray in September 2018, mild disc osteophytic disease of lower lumbar spine, aortic atherosclerosis.  He retired since 2011,  Chad Chen know him for 5 years, he was noted to have gradual onset memory loss, searching for words, has become much less active, not motivated, making poor decisions while driving,  Recent few months, he also has urinary incontinence, wet his pants, occasionally bowel incident,  UPDATE November 14 2017: MRI of lumbar spine on September 22, 2017, multilevel degenerative disc disorder, moderate right foraminal narrowing at L4-5, L5-S1,  MRI of cervical spine, degenerative spondylosis at C4-5, C5-6, C6 and 7 with narrowing of the ventral subarachnoid space, but no compression of the cord, bilateral foraminal narrowing throughout the region,  MRI of the brain showed no acute abnormality, generalized atrophy, supratentorium small vessel disease,  He continues to decline gradually, worsening gait abnormality, very sedentary lifestyle, worsening urinary incontinence, complains of chronic low back pain  Also has worsening depression  REVIEW OF SYSTEMS: Full 14 system review of systems performed and notable only for memory loss, speech difficulty, weakness, tremor, confusion, decreased concentration, depression, joint pain, walking difficulty, urgency, incontinence of bladder, frequent urination, daytime sleepiness, runny nose, hearing loss, fatigue   ALLERGIES: Allergies  Allergen Reactions  . Ivp Dye [Iodinated Diagnostic Agents] Other (See Comments)    Cannot take due to kidney disease   . Metrizamide Other (See Comments)    Cannot take due to kidney disease     HOME MEDICATIONS: Current Outpatient Medications  Medication Sig Dispense Refill  . Adalimumab (HUMIRA PEN) 40 MG/0.8ML PNKT Inject 40 mg into the skin every 14 (fourteen) days.    Marland Kitchen aspirin 81 MG chewable tablet Chew 81 mg by mouth daily.    Marland Kitchen atorvastatin (  LIPITOR) 40 MG tablet Take 1 tablet (40 mg total) by mouth daily. 30 tablet 0  . diphenhydramine-acetaminophen (TYLENOL PM) 25-500 MG TABS tablet Take 1 tablet by mouth at bedtime.      Mariane Baumgarten Calcium (STOOL SOFTENER PO) Take 2 tablets by mouth daily.    Marland Kitchen gabapentin (NEURONTIN) 300 MG capsule Take 300 mg by mouth See admin instructions. Take 1 capsule (300 mg) by mouth daily at bedtime, may also take 1 capsule twice during the day as needed for foot pain    . glipiZIDE (GLUCOTROL XL) 2.5 MG 24 hr tablet Take 2.5 mg by mouth See admin instructions. Take 1 tablet (2.5 mg) by mouth twice daily - with breakfast and lunch    . liraglutide (VICTOZA) 18 MG/3ML SOPN Inject 1.2 mg into the skin as needed.     Marland Kitchen lisinopril (PRINIVIL,ZESTRIL) 40 MG tablet Take 40 mg by mouth daily.    . Melatonin 10 MG CAPS Take 10 mg by mouth at bedtime.    . Multiple Vitamin (MULTIVITAMIN WITH MINERALS) TABS tablet Take 1 tablet by mouth daily.    . Omega-3 Fatty Acids (FISH OIL) 1000 MG CAPS Take 2,000 mg by mouth daily.    . pioglitazone (ACTOS) 15 MG tablet Take 15 mg by mouth at bedtime.    . SODIUM BICARBONATE PO Take 2 tablets by mouth daily.    Marland Kitchen UNABLE TO FIND Currently in study at [facility] for his stage 3 kidney failure (he is unsure if he is getting actual medication or a placebo). Unsure of the name of the potential drug.    . warfarin (COUMADIN) 6 MG tablet Take 1 tablet (6 mg total) by mouth daily at 6 PM.     No current facility-administered medications for this visit.     PAST MEDICAL HISTORY: Past Medical History:  Diagnosis Date  . Cancer ([place])    colon, kidney  . Depression   . Diabetes mellitus without complication ([employer])   . DVT (deep venous thrombosis) ([place])   . Factor V Leiden ([place])   . Gait abnormality   . Hypertension   . Memory difficulty   . Psoriasis   . Renal disorder     PAST SURGICAL HISTORY: Past Surgical History:  Procedure Laterality Date  . NEPHRECTOMY Left     FAMILY HISTORY: Family History  Problem Relation Age of Onset  . Alzheimer's disease Mother   . Pneumonia Mother   . Emphysema Father   . Pancreatic cancer Maternal Grandfather      SOCIAL HISTORY:  Social History   Socioeconomic History  . Marital status: Divorced    Spouse name: Not on file  . Number of children: 2  . Years of education: 74  . Highest education level: Master's degree (e.g., MA, MS, MEng, MEd, MSW, MBA)  Occupational History  . Occupation: Retired  Scientific laboratory technician  . Financial resource strain: Not on file  . Food insecurity:    Worry: Not on file    Inability: Not on file  . Transportation needs:    Medical: Not on file    Non-medical: Not on file  Tobacco Use  . Smoking status: Former Research scientist (life sciences)  . Smokeless tobacco: Never Used  Substance and Sexual Activity  . Alcohol use: Yes    Comment: rare  . Drug use: No  . Sexual activity: Not on file  Lifestyle  . Physical activity:    Days per week: Not on file    Minutes  per session: Not on file  . Stress: Not on file  Relationships  . Social connections:    Talks on phone: Not on file    Gets together: Not on file    Attends religious service: Not on file    Active member of club or organization: Not on file    Attends meetings of clubs or organizations: Not on file    Relationship status: Not on file  . Intimate partner violence:    Fear of current or ex partner: Not on file    Emotionally abused: Not on file    Physically abused: Not on file    Forced sexual activity: Not on file  Other Topics Concern  . Not on file  Social History Narrative   Lives at home with his significant other.   Right-handed.   Occasional caffeine use.     PHYSICAL EXAM   Vitals:   11/14/17 1554  BP: (!) 167/94  Pulse: 79  Weight: 297 lb (134.7 kg)  Height: 5\' 11"  (1.803 m)    Not recorded      Body mass index is 41.42 kg/m.  PHYSICAL EXAMNIATION:  Gen: NAD, conversant, well nourised, obese, well groomed                     Cardiovascular: Regular rate rhythm, no peripheral edema, warm, nontender. Eyes: Conjunctivae clear without exudates or hemorrhage Neck: Supple, no carotid  bruits. Pulmonary: Clear to auscultation bilaterally   NEUROLOGICAL EXAM: MMSE - Mini Mental State Exam 09/06/2017  Orientation to time 4  Orientation to Place 5  Registration 3  Attention/ Calculation 5  Recall 3  Language- name 2 objects 2  Language- repeat 1  Language- follow 3 step command 3  Language- read & follow direction 1  Write a sentence 1  Copy design 1  Total score 29  animal naming 4   CRANIAL NERVES: CN II: Visual fields are full to confrontation. Fundoscopic exam is normal with sharp discs and no vascular changes. Pupils are round equal and briskly reactive to light. CN III, IV, VI: extraocular movement are normal. No ptosis. CN V: Facial sensation is intact to pinprick in all 3 divisions bilaterally. Corneal responses are intact.  CN VII: Face is symmetric with normal eye closure and smile. CN VIII: Hearing is normal to rubbing fingers CN IX, X: Palate elevates symmetrically. Phonation is normal. CN XI: Head turning and shoulder shrug are intact CN XII: Tongue is midline with normal movements and no atrophy.  MOTOR: There is no pronator drift of out-stretched arms. Muscle bulk and tone are normal. Muscle strength is normal.  REFLEXES: Reflexes are 2+ and symmetric at the biceps, triceps, knees, and ankles. Plantar responses are flexor.  SENSORY: Length dependent decreased to light touch, pinprick, and vibratory sensation to toes COORDINATION: Rapid alternating movements and fine finger movements are intact. There is no dysmetria on finger-to-nose and heel-knee-shin.    GAIT/STANCE:  He needs push up to get up from seated position, unsteady, wide-based,   DIAGNOSTIC DATA (LABS, IMAGING, TESTING) - I reviewed patient records, labs, notes, testing and imaging myself where available.   ASSESSMENT AND PLAN  Chad Chen is a 67 y.o. male   Progressive worsening memory loss, gait abnormality, brisk reflexes  Worsening urinary  incontinence  Differentiation diagnosis include central nervous system degenerative disorder, deconditioning, obesity, right foot pain  Laboratory evaluations  Referred to urology  MRI of thoracic spine to complete evaluation, EMG nerve  conduction study  He has completed physical therapy with limited help, he was not able to continue the physical activity.  Chad Chen, M.D. Ph.D.  [facility] [address] [place], [place] [address] Ph: [phone] Fax: [phone]  CC: Chad Sauer, MD

## 2017-11-15 ENCOUNTER — Telehealth: Payer: Self-pay | Admitting: Neurology

## 2017-11-15 LAB — C-REACTIVE PROTEIN: CRP: 2 mg/L (ref 0.0–4.9)

## 2017-11-15 LAB — CK: Total CK: 181 U/L (ref 24–204)

## 2017-11-15 LAB — ANA W/REFLEX IF POSITIVE: Anti Nuclear Antibody(ANA): NEGATIVE

## 2017-11-15 LAB — SEDIMENTATION RATE: SED RATE: 44 mm/h — AB (ref 0–30)

## 2017-11-15 LAB — FERRITIN: Ferritin: 298 ng/mL (ref 30–400)

## 2017-11-15 NOTE — Telephone Encounter (Signed)
Please call patient, laboratory evaluation showed mild elevated ESR, in the setting of normal C-reactive protein, above findings have  unknown clinical significance, rest of the laboratory evaluation was normal

## 2017-11-15 NOTE — Telephone Encounter (Signed)
Faxed order to [facility] in [place] since that is where he had his last images done in Feb 2019. They will contact the patient to schedule. [employer] Medicare: no auth.

## 2017-11-16 ENCOUNTER — Encounter: Payer: Self-pay | Admitting: *Deleted

## 2017-11-16 ENCOUNTER — Telehealth: Payer: Self-pay | Admitting: Neurology

## 2017-11-16 MED ORDER — BUPROPION HCL ER (XL) 300 MG PO TB24: 300.0000 mg | ORAL_TABLET | Freq: Every day | ORAL | 11 refills | Status: AC

## 2017-11-16 NOTE — Telephone Encounter (Signed)
I was able to talk with his primary care physician, Dr. Leafy Ro,   Reviewed CT head in March 2019, there is no evidence of normal pressure hydrocephalus,  Patient continued to decline gradually over the past few months, complains of right foot pain, less mobility, increased depression, withdraw  MRI of thoracic spine is pending.  Laboratory evaluation showed no treatable etiology  I have suggested aggressive physical therapy, he will return to clinic in May for EMG nerve conduction study

## 2017-11-16 NOTE — Telephone Encounter (Addendum)
Spoke to his significant other, Elizbeth Squires - she is aware of his results.  She wanted to discuss next steps.  She is concerned about his physical/mental condition.  She would like a call back after 2pm today.  He has a pending appt for NCV/EMG on 12/16/17.  Additionally, Dr. Krista Blue is recommending starting sertraline 50mg  and slowly titrating to 100mg  daily.

## 2017-11-16 NOTE — Addendum Note (Signed)
Addended by: Noberto Retort C on: 11/16/2017 03:02 PM   Modules accepted: Orders

## 2017-11-16 NOTE — Telephone Encounter (Addendum)
Spoke to his significant other, Elizbeth Squires - states he has tried sertraline in the past, unsuccessfully.  He is currently taking Wellbutrin XL 150mg  at bedtime without adverse side effects, however, she feels this dose is unhelpful.  Per vo by Dr. Krista Blue, she will provide new rx for Wellbutrin XL 300mg  at bedtime.  She is agreeable to this change and the new prescription has been sent to the pharmacy.  Additionally, he has been added to the wait list for an earlier appt for his NCV/EMG at his significant other's request.

## 2017-11-21 ENCOUNTER — Encounter: Payer: Self-pay | Admitting: Neurology

## 2017-12-05 ENCOUNTER — Telehealth: Payer: Self-pay | Admitting: Neurology

## 2017-12-05 NOTE — Telephone Encounter (Signed)
Please call patient, MRI of the thoracic spine showed mild degenerative changes, most prominent is at C5-6, with mild to moderate canal stenosis, but there is no evidence of spinal cord compression

## 2017-12-05 NOTE — Telephone Encounter (Signed)
Spoke to his significant other, Elizbeth Squires, who is on his DPR.  She is aware of the results and will discuss with the patient.  He has a pending appt on 12/16/17 for NCV/EMG.  I provided our number for him to call back if he has any questions.

## 2017-12-16 ENCOUNTER — Encounter: Payer: Medicare Other | Admitting: Neurology

## 2017-12-16 ENCOUNTER — Encounter

## 2017-12-26 ENCOUNTER — Emergency Department (HOSPITAL_BASED_OUTPATIENT_CLINIC_OR_DEPARTMENT_OTHER): Payer: Medicare Other

## 2017-12-26 ENCOUNTER — Other Ambulatory Visit: Payer: Self-pay

## 2017-12-26 ENCOUNTER — Emergency Department (HOSPITAL_BASED_OUTPATIENT_CLINIC_OR_DEPARTMENT_OTHER)
Admission: EM | Admit: 2017-12-26 | Discharge: 2017-12-27 | Disposition: A | Discharge status: Home / Self Care | Payer: Medicare Other | Attending: Emergency Medicine | Admitting: Emergency Medicine

## 2017-12-26 ENCOUNTER — Encounter (HOSPITAL_BASED_OUTPATIENT_CLINIC_OR_DEPARTMENT_OTHER): Payer: Self-pay | Admitting: *Deleted

## 2017-12-26 DIAGNOSIS — Z7984 Long term (current) use of oral hypoglycemic drugs: Secondary | ICD-10-CM | POA: Diagnosis not present

## 2017-12-26 DIAGNOSIS — Z87891 Personal history of nicotine dependence: Secondary | ICD-10-CM | POA: Diagnosis not present

## 2017-12-26 DIAGNOSIS — S20212A Contusion of left front wall of thorax, initial encounter: Secondary | ICD-10-CM | POA: Diagnosis not present

## 2017-12-26 DIAGNOSIS — Y9301 Activity, walking, marching and hiking: Secondary | ICD-10-CM | POA: Diagnosis not present

## 2017-12-26 DIAGNOSIS — Z85038 Personal history of other malignant neoplasm of large intestine: Secondary | ICD-10-CM | POA: Insufficient documentation

## 2017-12-26 DIAGNOSIS — Y929 Unspecified place or not applicable: Secondary | ICD-10-CM | POA: Diagnosis not present

## 2017-12-26 DIAGNOSIS — W0110XA Fall on same level from slipping, tripping and stumbling with subsequent striking against unspecified object, initial encounter: Secondary | ICD-10-CM | POA: Diagnosis not present

## 2017-12-26 DIAGNOSIS — Z7982 Long term (current) use of aspirin: Secondary | ICD-10-CM | POA: Insufficient documentation

## 2017-12-26 DIAGNOSIS — Z7901 Long term (current) use of anticoagulants: Secondary | ICD-10-CM | POA: Insufficient documentation

## 2017-12-26 DIAGNOSIS — Y999 Unspecified external cause status: Secondary | ICD-10-CM | POA: Diagnosis not present

## 2017-12-26 DIAGNOSIS — E1122 Type 2 diabetes mellitus with diabetic chronic kidney disease: Secondary | ICD-10-CM | POA: Insufficient documentation

## 2017-12-26 DIAGNOSIS — I129 Hypertensive chronic kidney disease with stage 1 through stage 4 chronic kidney disease, or unspecified chronic kidney disease: Secondary | ICD-10-CM | POA: Diagnosis not present

## 2017-12-26 DIAGNOSIS — N183 Chronic kidney disease, stage 3 (moderate): Secondary | ICD-10-CM | POA: Diagnosis not present

## 2017-12-26 DIAGNOSIS — S299XXA Unspecified injury of thorax, initial encounter: Secondary | ICD-10-CM | POA: Diagnosis present

## 2017-12-26 MED ORDER — HYDROCODONE-ACETAMINOPHEN 5-325 MG PO TABS
1.0000 | ORAL_TABLET | Freq: Once | ORAL | Status: AC
  Administered 2017-12-27: 1 via ORAL
  Filled 2017-12-26 (×2): qty 1

## 2017-12-26 NOTE — ED Triage Notes (Signed)
Pt c/o fall x 2 days ago c/o left rib cage pain . HX falls

## 2017-12-27 MED ORDER — DICLOFENAC SODIUM 1 % TD GEL: 2.0000 g | Freq: Four times a day (QID) | TRANSDERMAL | 0 refills | Status: AC

## 2017-12-27 NOTE — Discharge Instructions (Addendum)
You can take Tylenol or Ibuprofen as directed for pain. You can alternate Tylenol and Ibuprofen every 4 hours. If you take Tylenol at 1pm, then you can take Ibuprofen at 5pm. Then you can take Tylenol again at 9pm.   Apply the voltaren gel as directed.   Follow-up with your primary care doctor in the next 24 to 40 hours for further evaluation.  Return to emergency department for any worsening pain, difficulty breathing, fevers or any other worsening or concerning symptoms.

## 2017-12-27 NOTE — ED Provider Notes (Signed)
[place] EMERGENCY DEPARTMENT Provider Note   CSN: [id number] Arrival date & time: 12/26/17  2230     History   Chief Complaint Chief Complaint  Patient presents with  . Rib Injury    HPI Chad Chen is a 67 y.o. male possible history of factor V Leiden, hypertension, normal pressure hydrocephalus, who presents for evaluation of left lateral chest wall pain that began 2 days ago after mechanical fall.  Patient has a history of falls related to his normal pressure hydrocephalus.  Patient reports he had a fall approximately 2 days ago where he landed on the left lateral chest wall.  Patient states he did not hit his head or have any loss of consciousness.  Patient reports that since then, he has had pain noted to the left lateral chest wall.  Patient reports his pain is worse with movement worse with deep inspiration and coughing.  Patient states that he has not taken anything for the pain.  Patient denies any fevers, neck pain, vision changes, numbness/weakness of his extremities, vomiting, abdominal pain.  The history is provided by the patient.    Past Medical History:  Diagnosis Date  . Cancer ([place])    colon, kidney  . Depression   . Diabetes mellitus without complication ([place])   . DVT (deep venous thrombosis) ([place])   . Factor V Leiden ([place])   . Gait abnormality   . Hypertension   . Memory difficulty   . Psoriasis   . Renal disorder     Patient Active Problem List   Diagnosis Date Noted  . Gait abnormality 11/14/2017  . Expressive aphasia   . Stroke-like episode ([place]) 09/19/2016  . Depression 09/19/2016  . Insomnia 09/19/2016  . Diabetes mellitus, type II, insulin dependent ([place]) 09/19/2016  . Factor V Leiden mutation ([place]) 09/19/2016  . History of DVT (deep vein thrombosis) 09/19/2016  . CKD (chronic kidney disease), stage III ([place]) 09/19/2016  . AKI (acute kidney injury) ([place]) 09/19/2016  . Essential hypertension 09/19/2016    Past Surgical  History:  Procedure Laterality Date  . NEPHRECTOMY Left         Home Medications    Prior to Admission medications   Medication Sig Start Date End Date Taking? Authorizing Provider  acetaminophen (TYLENOL) 500 MG tablet Take 500 mg by mouth every 6 (six) hours as needed.   Yes [provider]  glipiZIDE (GLUCOTROL XL) 2.5 MG 24 hr tablet Take 2.5 mg by mouth See admin instructions. Take 1 tablet (2.5 mg) by mouth twice daily - with breakfast and lunch   Yes [provider]  Adalimumab (HUMIRA PEN) 40 MG/0.8ML PNKT Inject 40 mg into the skin every 14 (fourteen) days.    [provider]  aspirin 81 MG chewable tablet Chew 81 mg by mouth daily.    [provider]  atorvastatin (LIPITOR) 40 MG tablet Take 1 tablet (40 mg total) by mouth daily. 09/21/16   Geradine Girt, DO  buPROPion (WELLBUTRIN XL) 300 MG 24 hr tablet Take 1 tablet (300 mg total) by mouth daily. 11/16/17   Marcial Pacas, MD  diclofenac sodium (VOLTAREN) 1 % GEL Apply 2 g topically 4 (four) times daily. 12/27/17   Volanda Napoleon, PA-C  diphenhydramine-acetaminophen (TYLENOL PM) 25-500 MG TABS tablet Take 1 tablet by mouth at bedtime.     [provider]  Docusate Calcium (STOOL SOFTENER PO) Take 2 tablets by mouth daily.    [provider]  gabapentin (  NEURONTIN) 300 MG capsule Take 300 mg by mouth See admin instructions. Take 1 capsule (300 mg) by mouth daily at bedtime, may also take 1 capsule twice during the day as needed for foot pain    [provider]  liraglutide (VICTOZA) 18 MG/3ML SOPN Inject 1.2 mg into the skin as needed.     [provider]  lisinopril (PRINIVIL,ZESTRIL) 40 MG tablet Take 40 mg by mouth daily.    [provider]  Melatonin 10 MG CAPS Take 10 mg by mouth at bedtime.    [provider]  Multiple Vitamin (MULTIVITAMIN WITH MINERALS) TABS tablet Take 1 tablet by mouth daily.    [provider]  Omega-3  Fatty Acids (FISH OIL) 1000 MG CAPS Take 2,000 mg by mouth daily.    [provider]  pioglitazone (ACTOS) 15 MG tablet Take 15 mg by mouth at bedtime.    [provider]  SODIUM BICARBONATE PO Take 2 tablets by mouth daily.    [provider]  UNABLE TO FIND Currently in study at [facility] for his stage 3 kidney failure (he is unsure if he is getting actual medication or a placebo). Unsure of the name of the potential drug.    [provider]  warfarin (COUMADIN) 6 MG tablet Take 1 tablet (6 mg total) by mouth daily at 6 PM. 09/20/16   Geradine Girt, DO    Family History Family History  Problem Relation Age of Onset  . Alzheimer's disease Mother   . Pneumonia Mother   . Emphysema Father   . Pancreatic cancer Maternal Grandfather     Social History Social History   Tobacco Use  . Smoking status: Former Research scientist (life sciences)  . Smokeless tobacco: Never Used  Substance Use Topics  . Alcohol use: Yes    Comment: rare  . Drug use: No     Allergies   Ivp dye [iodinated diagnostic agents] and Metrizamide   Review of Systems Review of Systems  Constitutional: Negative for fever.  Respiratory: Negative for cough and shortness of breath.   Cardiovascular: Negative for chest pain.  Gastrointestinal: Negative for abdominal pain, nausea and vomiting.  Genitourinary: Negative for hematuria.  Musculoskeletal: Negative for back pain and neck pain.       Left lateral chest wall pain  Neurological: Negative for weakness, numbness and headaches.     Physical Exam Updated Vital Signs BP (!) 143/96 (BP Location: Left Arm)   Pulse (!) 111   Temp 98.4 F (36.9 C) (Oral)   Resp 20   Ht 5\' 11"  (1.803 m)   Wt 128.8 kg (284 lb)   SpO2 92%   BMI 39.61 kg/m   Physical Exam  Constitutional: He is oriented to person, place, and time. He appears well-developed and well-nourished.  HENT:  Head: Normocephalic and atraumatic.  Mouth/Throat: Oropharynx is clear  and moist and mucous membranes are normal.  Eyes: Pupils are equal, round, and reactive to light. Conjunctivae, EOM and lids are normal.  Neck: Full passive range of motion without pain.  Full flexion/extension and lateral movement of neck fully intact. No bony midline tenderness. No deformities or crepitus.   Cardiovascular: Normal rate, regular rhythm, normal heart sounds and normal pulses. Exam reveals no gallop and no friction rub.  No murmur heard. Pulmonary/Chest: Effort normal and breath sounds normal. He has no decreased breath sounds.  Lungs clear to auscultation bilaterally.  Symmetric chest rise.  No wheezing, rales, rhonchi.  Tenderness palpation  noted to the left lateral chest wall.  No deformity or crepitus noted.  No overlying ecchymosis, edema.    Abdominal: Soft. Normal appearance. There is no tenderness. There is no rigidity and no guarding.  Abdomen is soft, non-distended, non-tender. No rigidity, No guarding. No peritoneal signs.  Musculoskeletal: Normal range of motion.  Neurological: He is alert and oriented to person, place, and time.  Skin: Skin is warm and dry. Capillary refill takes less than 2 seconds.  Psychiatric: He has a normal mood and affect. His speech is normal.  Nursing note and vitals reviewed.    ED Treatments / Results  Labs (all labs ordered are listed, but only abnormal results are displayed) Labs Reviewed - No data to display  EKG None  Radiology Dg Ribs Unilateral W/chest Left  Result Date: 12/26/2017 CLINICAL DATA:  Fall with anterior lower rib pain. EXAM: LEFT RIBS AND CHEST - 3+ VIEW COMPARISON:  None FINDINGS: Normal heart size. Mild aortic atherosclerotic calcifications noted. Decreased lung volumes. Atelectasis noted within the left lower lobe. No displaced rib fractures identified. IMPRESSION: No rib fractures identified. Decreased lung volumes with left base atelectasis. Aortic Atherosclerosis (ICD10-I70.0). Electronically Signed   By:  Kerby Moors M.D.   On: 12/26/2017 23:10    Procedures Procedures (including critical care time)  Medications Ordered in ED Medications  HYDROcodone-acetaminophen (NORCO/VICODIN) 5-325 MG per tablet 1 tablet (1 tablet Oral Given 12/27/17 0050)     Initial Impression / Assessment and Plan / ED Course  I have reviewed the triage vital signs and the nursing notes.  Pertinent labs & imaging results that were available during my care of the patient were reviewed by me and considered in my medical decision making (see chart for details).     67 y.o. male who presents for evaluation of left lateral chest wall pain that began 2 days ago after fall that occurred yesterday.  Patient recently diagnosed with normal pressure hydrocephalus and is scheduled to get a shunt inserted.  As result, he has been falling more than normal.  Reports that he fell, landing on his left chest.  No head injury, LOC.  Patient reports that since the fall, as he has had left lateral chest wall pain.  States it is worse with movement, coughing, deep inspiration.  No chest pain otherwise.  No difficulty breathing.  Has not taken anything for the pain.  Patient is afebrile, non-toxic appearing, sitting comfortably on examination table. Vital signs reviewed.  Slightly tachycardic and hypertensive.  Suspect that this may be related to pain.  O2 sats are stable.  On exam, patient has tenderness palpation noted to the lateral left chest wall.  No deformity or crepitus noted.  Lungs clear to auscultation with no decreased breath sounds.  Consider fracture versus location versus contusion.  Initial left rib x-ray ordered at triage.  Patient is currently on Coumadin and states he has been compliant with it.  Do not suspect PE given history/physical exam and the fact that his pain started after mechanical fall.  Patient did not hit his head or have any LOC.  Wife states he is at baseline.  No indication for head CT at this time.   X-ray  reviewed.  Negative for any acute fracture dislocation.  Discussed results with patient and wife.  Symptoms likely result of a contusion.  Offered analgesics but patient declined.  Repeat vitals show he still tachycardic.  O2 sats are stable between 93% on room air which is his  baseline.   Repeat vitals show patient is still tachycardic.  I suspect that this is due to pain.  He declined analgesics here in the department.  Discussed with Dr. Florina Ou who independently evaluated patient.  Patient stable for discharge at this time.   Offered analgesics the patient again and he accepted.  Patient instructed to follow-up with primary care doctor for further evaluation.  Patient had ample opportunity for questions and discussion. All patient's questions were answered with full understanding. Strict return precautions discussed. Patient expresses understanding and agreement to plan.   Final Clinical Impressions(s) / ED Diagnoses   Final diagnoses:  Contusion of rib on left side, initial encounter    ED Discharge Orders        Ordered    diclofenac sodium (VOLTAREN) 1 % GEL  4 times daily     12/27/17 0024       Volanda Napoleon, PA-C 12/27/17 1644    Molpus, Jenny Reichmann, MD 12/27/17 2228

## 2017-12-28 ENCOUNTER — Other Ambulatory Visit: Payer: Self-pay

## 2017-12-28 ENCOUNTER — Encounter (HOSPITAL_BASED_OUTPATIENT_CLINIC_OR_DEPARTMENT_OTHER): Payer: Self-pay

## 2017-12-28 ENCOUNTER — Emergency Department (HOSPITAL_BASED_OUTPATIENT_CLINIC_OR_DEPARTMENT_OTHER)
Admission: EM | Admit: 2017-12-28 | Discharge: 2017-12-28 | Disposition: A | Discharge status: Home / Self Care | Payer: Medicare Other | Attending: Emergency Medicine | Admitting: Emergency Medicine

## 2017-12-28 ENCOUNTER — Emergency Department (HOSPITAL_BASED_OUTPATIENT_CLINIC_OR_DEPARTMENT_OTHER): Payer: Medicare Other

## 2017-12-28 DIAGNOSIS — N183 Chronic kidney disease, stage 3 (moderate): Secondary | ICD-10-CM | POA: Insufficient documentation

## 2017-12-28 DIAGNOSIS — R1032 Left lower quadrant pain: Secondary | ICD-10-CM | POA: Diagnosis not present

## 2017-12-28 DIAGNOSIS — I129 Hypertensive chronic kidney disease with stage 1 through stage 4 chronic kidney disease, or unspecified chronic kidney disease: Secondary | ICD-10-CM | POA: Diagnosis not present

## 2017-12-28 DIAGNOSIS — Z85528 Personal history of other malignant neoplasm of kidney: Secondary | ICD-10-CM | POA: Insufficient documentation

## 2017-12-28 DIAGNOSIS — Z7984 Long term (current) use of oral hypoglycemic drugs: Secondary | ICD-10-CM | POA: Diagnosis not present

## 2017-12-28 DIAGNOSIS — Z7982 Long term (current) use of aspirin: Secondary | ICD-10-CM | POA: Insufficient documentation

## 2017-12-28 DIAGNOSIS — E1122 Type 2 diabetes mellitus with diabetic chronic kidney disease: Secondary | ICD-10-CM | POA: Diagnosis not present

## 2017-12-28 DIAGNOSIS — Z87891 Personal history of nicotine dependence: Secondary | ICD-10-CM | POA: Insufficient documentation

## 2017-12-28 DIAGNOSIS — Z85038 Personal history of other malignant neoplasm of large intestine: Secondary | ICD-10-CM | POA: Diagnosis not present

## 2017-12-28 DIAGNOSIS — K5900 Constipation, unspecified: Secondary | ICD-10-CM | POA: Insufficient documentation

## 2017-12-28 DIAGNOSIS — Z7901 Long term (current) use of anticoagulants: Secondary | ICD-10-CM | POA: Diagnosis not present

## 2017-12-28 DIAGNOSIS — Z79899 Other long term (current) drug therapy: Secondary | ICD-10-CM | POA: Insufficient documentation

## 2017-12-28 MED ORDER — POLYETHYLENE GLYCOL 3350 17 G PO PACK: 17.0000 g | PACK | Freq: Two times a day (BID) | ORAL | 0 refills | Status: AC

## 2017-12-28 MED ORDER — SENNOSIDES-DOCUSATE SODIUM 8.6-50 MG PO TABS: 2.0000 | ORAL_TABLET | Freq: Two times a day (BID) | ORAL | 0 refills | Status: AC

## 2017-12-28 NOTE — ED Triage Notes (Signed)
C/o constipation-reports last normal BM 2 weeks-multiple OTC without relief-presents to triage in w/c-NAD

## 2017-12-28 NOTE — Discharge Instructions (Addendum)
It was so nice to meet you!  You came to the emergency department with constipation. Your abdominal x-ray showed a moderate amount of stool. We were unable to manually remove the stool. We have prescribed two constipation medications. Please use Senokot-S 2 tablets twice a day and Miralax 1 capful twice daily. You can also continue to use suppositories and enemas at home to see if this helps.   If you start having nausea, vomiting, or severe abdominal pain, please come back to the emergency department.

## 2017-12-28 NOTE — ED Provider Notes (Signed)
[place] EMERGENCY DEPARTMENT Provider Note   CSN: [id number] Arrival date & time: 12/28/17  1436     History   Chief Complaint Chief Complaint  Patient presents with  . Constipation    HPI Everado Pillsbury is a 67 y.o. male with a PMH of T2DM, factor V leiden, HTN, CKD III, depression, and normal pressure hydrocephalus presenting to the ED with constipation for the last two weeks. He has had a few very small BMs here and there, but he has not had a good BM in two weeks. He has tried oral laxatives, suppositories, and mag citrate without relief. Wife also tried giving him an enema, but he was "unable to hold it in". He has not had any nausea or vomiting. He having some "crampy", LLQ abdominal pain and bloating. No fevers, no chills.  Past Medical History:  Diagnosis Date  . Cancer ([place])    colon, kidney  . Depression   . Diabetes mellitus without complication ([place])   . DVT (deep venous thrombosis) ([place])   . Factor V Leiden ([place])   . Gait abnormality   . Hypertension   . Memory difficulty   . Psoriasis   . Renal disorder     Patient Active Problem List   Diagnosis Date Noted  . Gait abnormality 11/14/2017  . Expressive aphasia   . Stroke-like episode ([place]) 09/19/2016  . Depression 09/19/2016  . Insomnia 09/19/2016  . Diabetes mellitus, type II, insulin dependent ([place]) 09/19/2016  . Factor V Leiden mutation ([place]) 09/19/2016  . History of DVT (deep vein thrombosis) 09/19/2016  . CKD (chronic kidney disease), stage III ([place]) 09/19/2016  . AKI (acute kidney injury) ([place]) 09/19/2016  . Essential hypertension 09/19/2016    Past Surgical History:  Procedure Laterality Date  . NEPHRECTOMY Left         Home Medications    Prior to Admission medications   Medication Sig Start Date End Date Taking? Authorizing Provider  acetaminophen (TYLENOL) 500 MG tablet Take 500 mg by mouth every 6 (six) hours as needed.    [provider]  Adalimumab (HUMIRA  PEN) 40 MG/0.8ML PNKT Inject 40 mg into the skin every 14 (fourteen) days.    [provider]  aspirin 81 MG chewable tablet Chew 81 mg by mouth daily.    [provider]  atorvastatin (LIPITOR) 40 MG tablet Take 1 tablet (40 mg total) by mouth daily. 09/21/16   Geradine Girt, DO  buPROPion (WELLBUTRIN XL) 300 MG 24 hr tablet Take 1 tablet (300 mg total) by mouth daily. 11/16/17   Marcial Pacas, MD  diclofenac sodium (VOLTAREN) 1 % GEL Apply 2 g topically 4 (four) times daily. 12/27/17   Volanda Napoleon, PA-C  diphenhydramine-acetaminophen (TYLENOL PM) 25-500 MG TABS tablet Take 1 tablet by mouth at bedtime.     [provider]  Docusate Calcium (STOOL SOFTENER PO) Take 2 tablets by mouth daily.    [provider]  gabapentin (NEURONTIN) 300 MG capsule Take 300 mg by mouth See admin instructions. Take 1 capsule (300 mg) by mouth daily at bedtime, may also take 1 capsule twice during the day as needed for foot pain    [provider]  glipiZIDE (GLUCOTROL XL) 2.5 MG 24 hr tablet Take 2.5 mg by mouth See admin instructions. Take 1 tablet (2.5 mg) by mouth twice daily - with breakfast and lunch    [provider]  liraglutide (VICTOZA) 18 MG/3ML SOPN Inject 1.2 mg  into the skin as needed.     [provider]  lisinopril (PRINIVIL,ZESTRIL) 40 MG tablet Take 40 mg by mouth daily.    [provider]  Melatonin 10 MG CAPS Take 10 mg by mouth at bedtime.    [provider]  Multiple Vitamin (MULTIVITAMIN WITH MINERALS) TABS tablet Take 1 tablet by mouth daily.    [provider]  Omega-3 Fatty Acids (FISH OIL) 1000 MG CAPS Take 2,000 mg by mouth daily.    [provider]  pioglitazone (ACTOS) 15 MG tablet Take 15 mg by mouth at bedtime.    [provider]  SODIUM BICARBONATE PO Take 2 tablets by mouth daily.    [provider]  UNABLE TO FIND Currently in study at [facility] for his stage 3  kidney failure (he is unsure if he is getting actual medication or a placebo). Unsure of the name of the potential drug.    [provider]  warfarin (COUMADIN) 6 MG tablet Take 1 tablet (6 mg total) by mouth daily at 6 PM. 09/20/16   Geradine Girt, DO    Family History Family History  Problem Relation Age of Onset  . Alzheimer's disease Mother   . Pneumonia Mother   . Emphysema Father   . Pancreatic cancer Maternal Grandfather     Social History Social History   Tobacco Use  . Smoking status: Former Research scientist (life sciences)  . Smokeless tobacco: Never Used  Substance Use Topics  . Alcohol use: Yes    Comment: rare  . Drug use: No     Allergies   Ivp dye [iodinated diagnostic agents] and Metrizamide   Review of Systems Review of Systems  Constitutional: Negative for chills and fever.  HENT: Negative for rhinorrhea and sore throat.   Eyes: Negative for pain and redness.  Respiratory: Negative for shortness of breath and wheezing.   Cardiovascular: Positive for leg swelling. Negative for chest pain and palpitations.  Gastrointestinal: Positive for abdominal pain and constipation. Negative for abdominal distention, anal bleeding, blood in stool, diarrhea, nausea and vomiting.  Genitourinary: Negative for dysuria.  Musculoskeletal: Positive for back pain. Negative for neck pain.  Skin: Negative for rash.  Neurological: Negative for dizziness, weakness and headaches.  Psychiatric/Behavioral: The patient is not nervous/anxious.      Physical Exam Updated Vital Signs BP (!) 145/90 (BP Location: Right Arm)   Pulse 83   Temp 98.5 F (36.9 C) (Oral)   Resp 20   SpO2 94%   Physical Exam  Constitutional: He is oriented to person, place, and time. He appears well-developed and well-nourished. No distress.  HENT:  Head: Normocephalic.  Nose: Nose normal.  Ecchymosis present under the left eye  Eyes: Pupils are equal, round, and reactive to light. EOM are normal.  Neck: Normal  range of motion. Neck supple.  Cardiovascular: Normal rate and regular rhythm.  Pulmonary/Chest: Effort normal and breath sounds normal. No respiratory distress. He has no wheezes. He has no rales.  Abdominal: Soft. Bowel sounds are normal. He exhibits no distension. There is no rebound and no guarding.  +mild LLQ tenderness to deep palpation  Musculoskeletal: Normal range of motion. He exhibits no tenderness or deformity.  1+ symmetric pitting edema to the mid shin bilaterally  Neurological: He is alert and oriented to person, place, and time. No cranial nerve deficit. He exhibits normal muscle tone.  Skin: Skin is warm and dry. Capillary refill takes less than 2 seconds. No rash noted.  Psychiatric: He has a normal mood and affect. His behavior is normal. Judgment and thought content normal.    ED Treatments / Results  Labs (all labs ordered are listed, but only abnormal results are displayed) Labs Reviewed - No data to display  EKG None  Radiology Dg Ribs Unilateral W/chest Left  Result Date: 12/26/2017 CLINICAL DATA:  Fall with anterior lower rib pain. EXAM: LEFT RIBS AND CHEST - 3+ VIEW COMPARISON:  None FINDINGS: Normal heart size. Mild aortic atherosclerotic calcifications noted. Decreased lung volumes. Atelectasis noted within the left lower lobe. No displaced rib fractures identified. IMPRESSION: No rib fractures identified. Decreased lung volumes with left base atelectasis. Aortic Atherosclerosis (ICD10-I70.0). Electronically Signed   By: Kerby Moors M.D.   On: 12/26/2017 23:10    Procedures Procedures (including critical care time)  Medications Ordered in ED Medications - No data to display   Initial Impression / Assessment and Plan / ED Course  I have reviewed the triage vital signs and the nursing notes.  Pertinent labs & imaging results that were available during my care of the patient were reviewed by me and considered in my medical decision making (see chart for  details).  67 year old male presenting with constipation for the last 2 weeks. No nausea, vomiting, or severe abdominal pain to suggest SBO. KUB with moderate stool burden, but no signs of obstruction. Manual disimpaction attempted, but unable to evacuate any stool. No gross blood. Patient felt to be safe for discharge home with return precautions. He was discharged with Senokot-S 2 tablets bid and Miralax 1 capful bid. He can continue to use suppositories and enemas at home as needed. He should follow-up with his PCP or return to the ED if symptoms persist or worsen.  Final Clinical Impressions(s) / ED Diagnoses   Final diagnoses:  None    ED Discharge Orders    None       Mayo, Pete Pelt, MD 12/28/17 1738    Margette Fast, MD 12/29/17 1318

## 2017-12-28 NOTE — ED Notes (Signed)
chaperone rectal exam with resident

## 2019-11-03 IMAGING — CT CT MAXILLOFACIAL W/O CM
3 of 10 series · 12 of 47 positions shown, 14 images · non-contrast
Comparison: Head CT without contrast 09/19/2016. Brain MRI
09/20/2016.

CLINICAL DATA: 66-year-old male status post fall today with left
face abrasions. Head and neck pain.

EXAM:
CT HEAD WITHOUT CONTRAST
CT MAXILLOFACIAL WITHOUT CONTRAST
CT CERVICAL SPINE WITHOUT CONTRAST
TECHNIQUE: Multidetector CT imaging of the head, cervical spine, and
maxillofacial structures were performed using the standard protocol
without intravenous contrast. Multiplanar CT image reconstructions
of the cervical spine and maxillofacial structures were also
generated.

[Series 10: coronal soft · coronal · 0.34mm/px · 3 of 76 slices shown]
[im 26/76  bone]
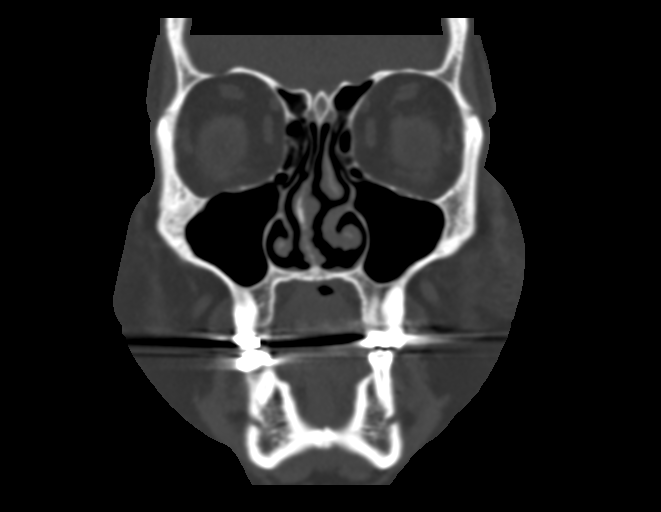
[im 38/76  bone]
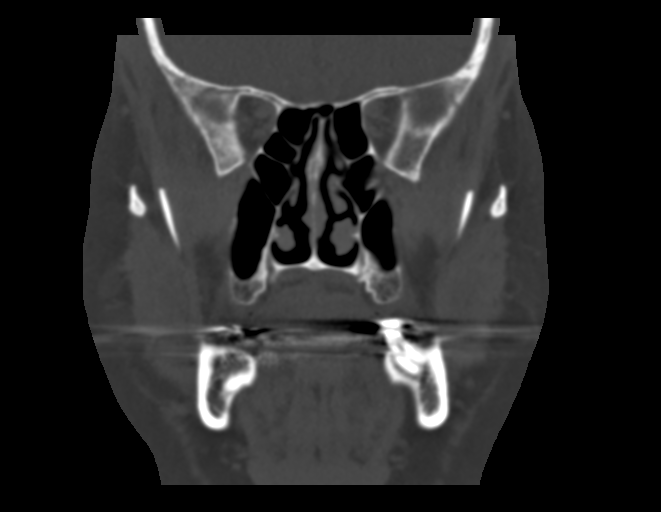
[im 51/76  bone]
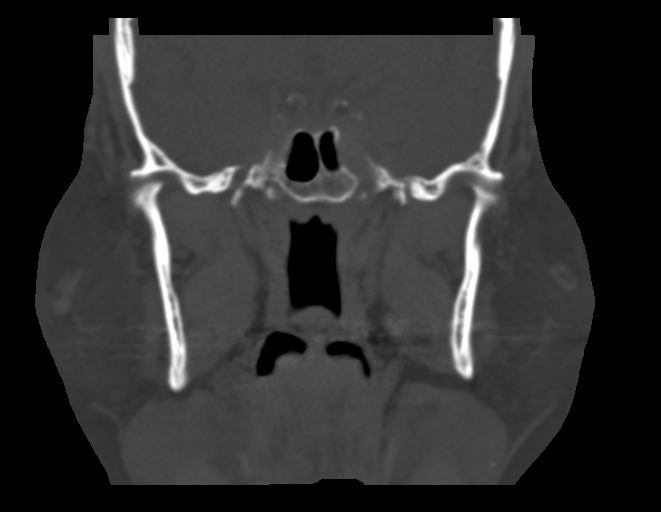

[Series 11: sagittal soft · sagittal · 0.30mm/px · 1 of 108 slices shown]
[im 54/108  bone]
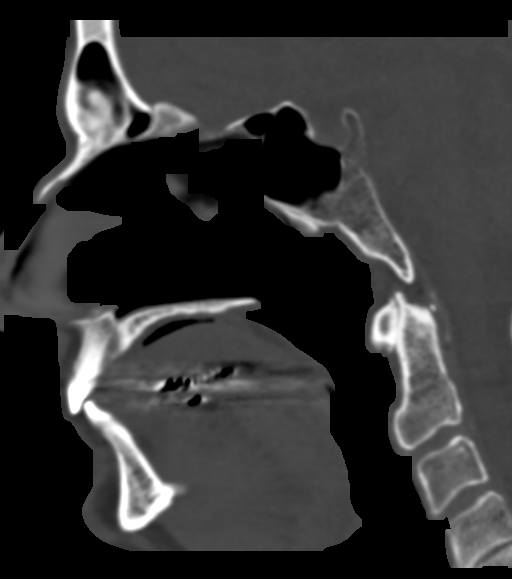

[Series 19: orthogonal axials · axial · 0.23mm/px · z∈[+863,+1026]mm · 8 of 110 slices shown, 10 images]
[im 13/110  brain]
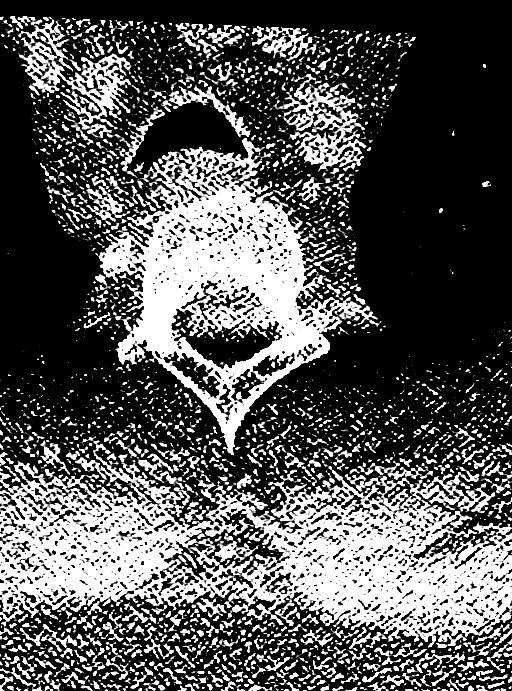
[im 13/110  bone]
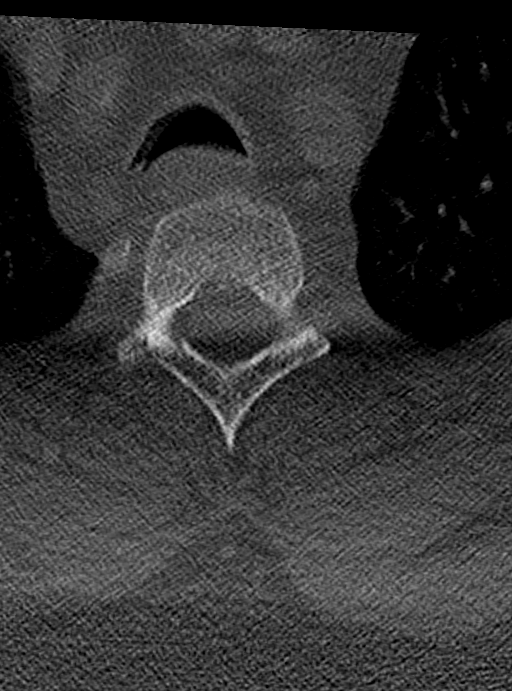
[im 25/110  bone]
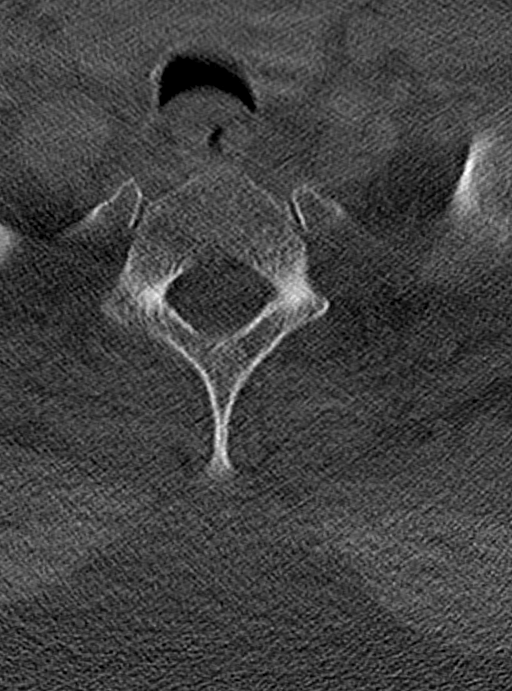
[im 37/110  bone]
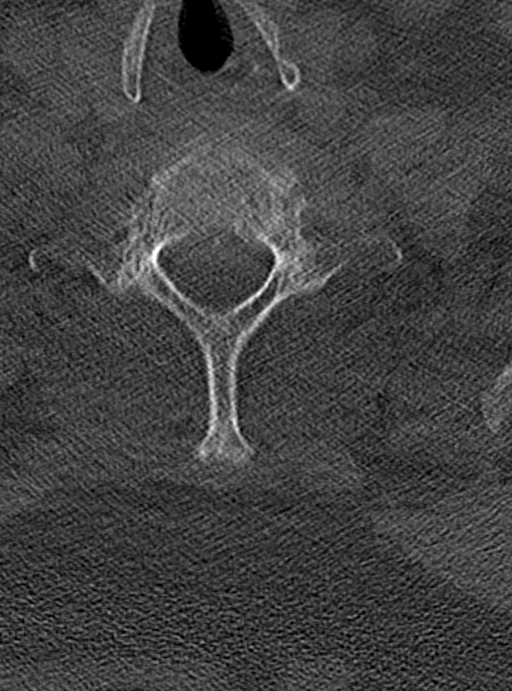
[im 49/110  bone]
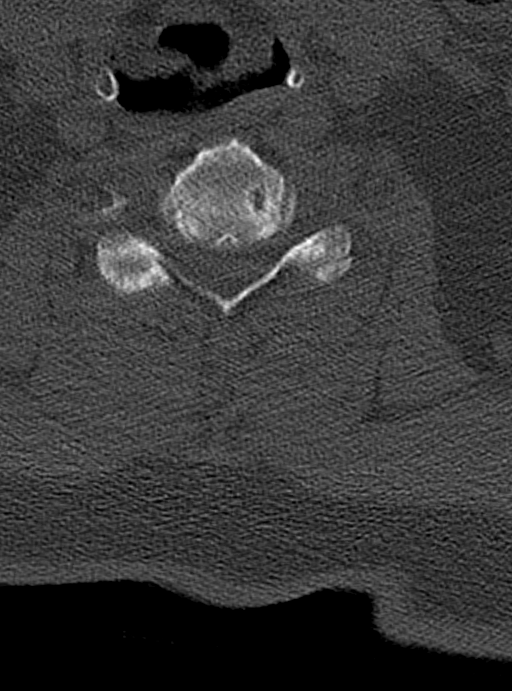
[im 61/110  brain]
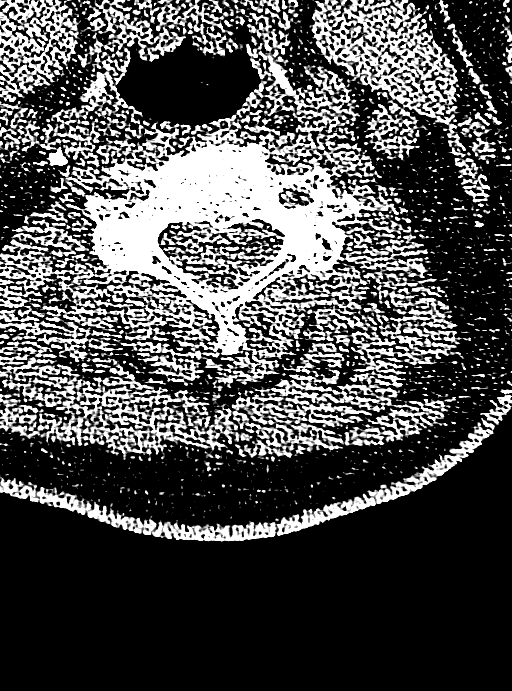
[im 61/110  bone]
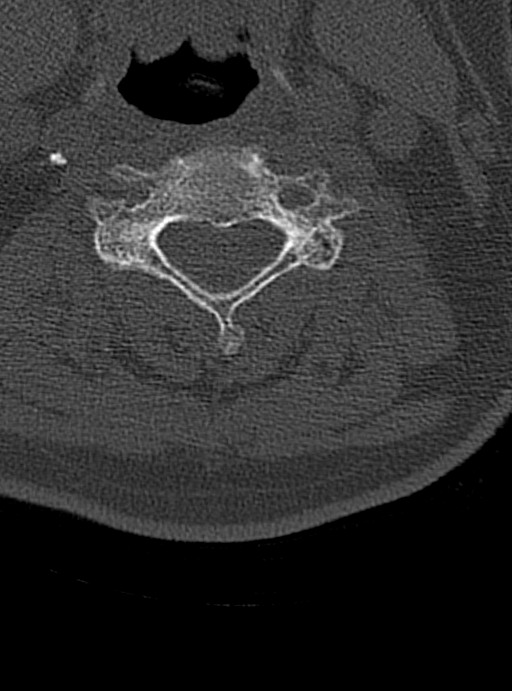
[im 73/110  bone]
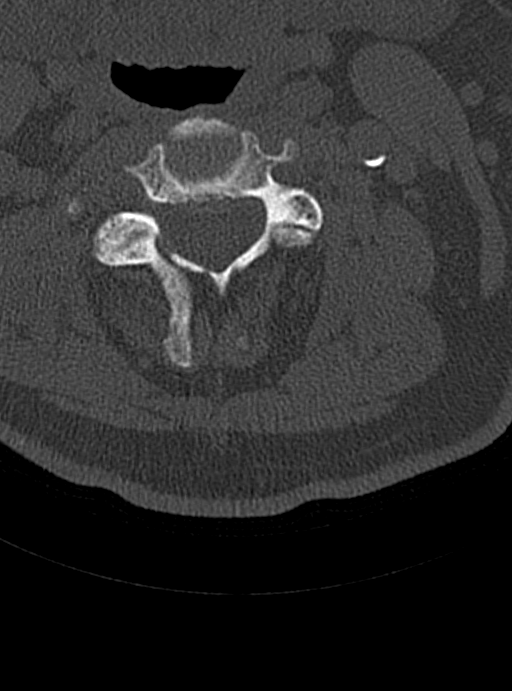
[im 85/110  bone]
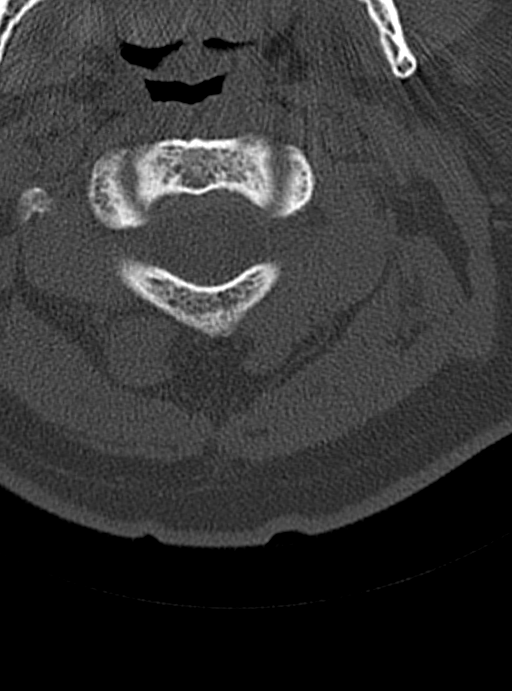
[im 97/110  bone]
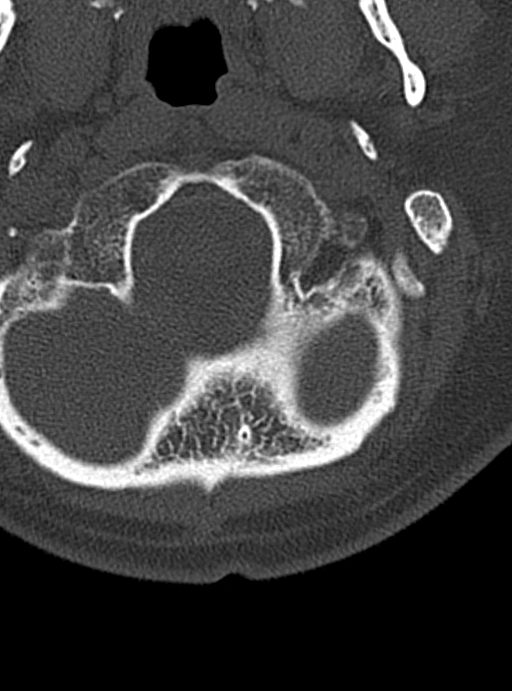

[12 of 47 positions shown; findings below may reference images not displayed]

FINDINGS: CT HEAD FINDINGS

Brain: Stable cerebral volume. Stable gray-white matter
differentiation throughout the brain. Patchy bilateral
periventricular and other cerebral white matter hypodensity, most
pronounced in the occipital lobes.

No midline shift, ventriculomegaly, mass effect, evidence of mass
lesion, intracranial hemorrhage or evidence of cortically based
acute infarction. No cortical encephalomalacia identified.

Vascular: Calcified atherosclerosis at the skull base. No suspicious
intracranial vascular hyperdensity.

Skull: Stable.  No skull fracture.

Other: No scalp hematoma or soft tissue gas identified.

CT MAXILLOFACIAL FINDINGS

Osseous: Mandible intact. Maxilla intact. No zygoma fracture. No
nasal bone fracture. Intact central skull base.

Orbits: Intact bilateral orbital walls. Bilateral globes and other
orbits soft tissues appear normal.

Sinuses: Paranasal sinuses and mastoids are stable and well
pneumatized. Tympanic cavities are clear.

Soft tissues: Negative visible noncontrast larynx, pharynx,
parapharyngeal spaces, retropharyngeal space, sublingual space,
submandibular spaces and parotid spaces. No upper cervical
lymphadenopathy.

Contusion and/or hematoma to the left anterior face just below the
left zygomatic arch (series 6, image 41. No subcutaneous gas
identified. No other superficial soft tissue injury identified.

CT CERVICAL SPINE FINDINGS

Alignment: Straightening of cervical lordosis. Bilateral posterior
element alignment is within normal limits. Cervicothoracic junction
alignment is within normal limits.

Skull base and vertebrae: Visualized skull base is intact. No
atlanto-occipital dissociation. Decreased bone detail at the
cervicothoracic junction appears related to quantum mottle artifact.
No cervical spine fracture identified.

Soft tissues and spinal canal: No prevertebral fluid or swelling. No
visible canal hematoma. Negative noncontrast neck soft tissues aside
from bilateral carotid bifurcation calcified atherosclerosis.

Disc levels: Severe disc space loss with bulky endplate spurring
C4-C5 through C6-C7. Suspect mild associated degenerative spinal
stenosis most pronounced at C5-C6.

Upper chest: Grossly intact visible upper thoracic levels. Negative
lung apices. Negative noncontrast superior mediastinum.
IMPRESSION: 1. Left face soft tissue injury without associated face or skull
fracture.
2.  No acute intracranial abnormality identified.
3. No acute fracture in the cervical spine. Widespread advanced
cervical spine disc and endplate degeneration with suspected
degenerative spinal stenosis maximal at C5-C6.

## 2020-01-27 IMAGING — CR DG ABDOMEN 1V
2 series · 2 of 2 positions shown · non-contrast
Comparison: None.

CLINICAL DATA: Lower abdominal pain and bloating.

EXAM:
ABDOMEN - 1 VIEW

[t abdomen supine (1 of 2)]
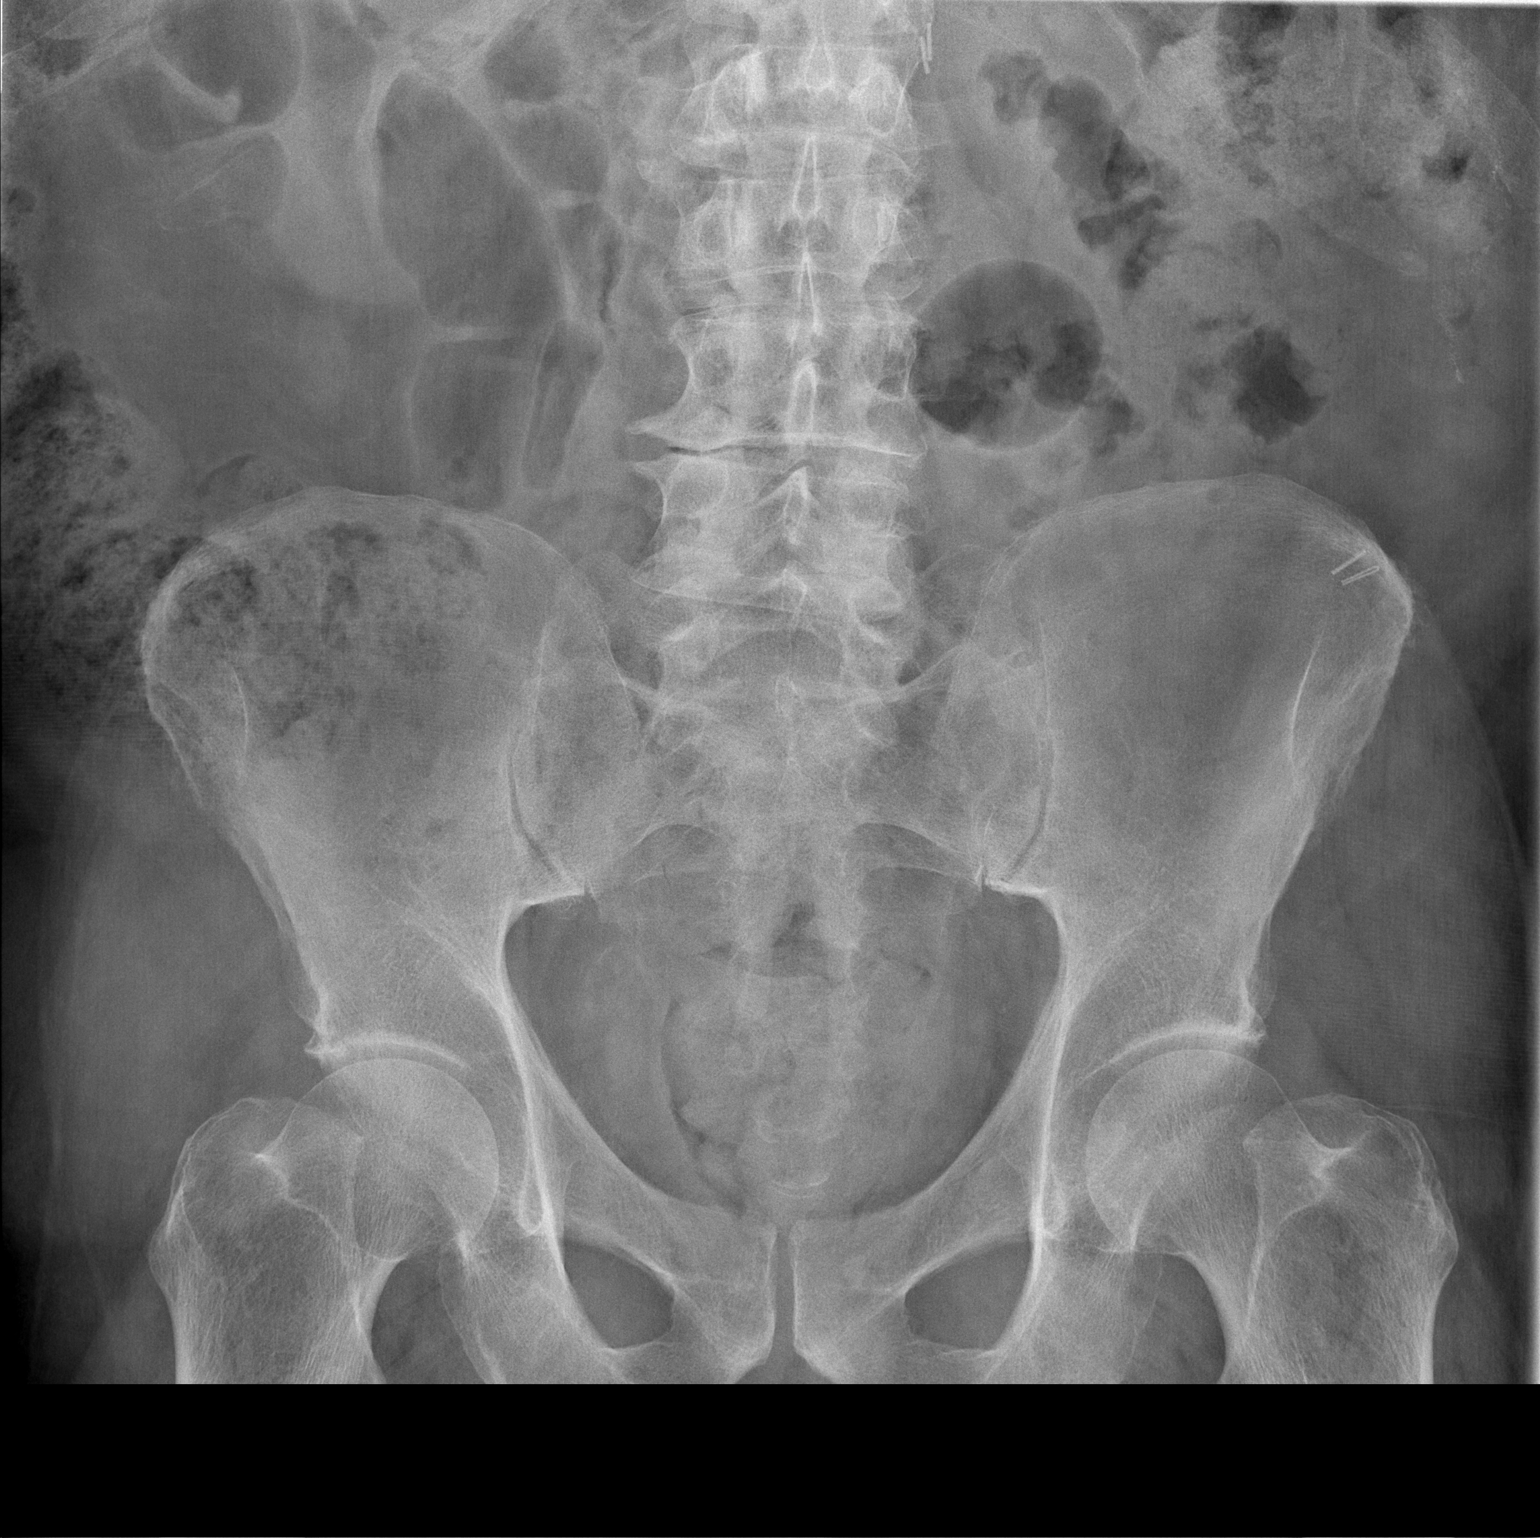

[t abdomen supine (2 of 2)]
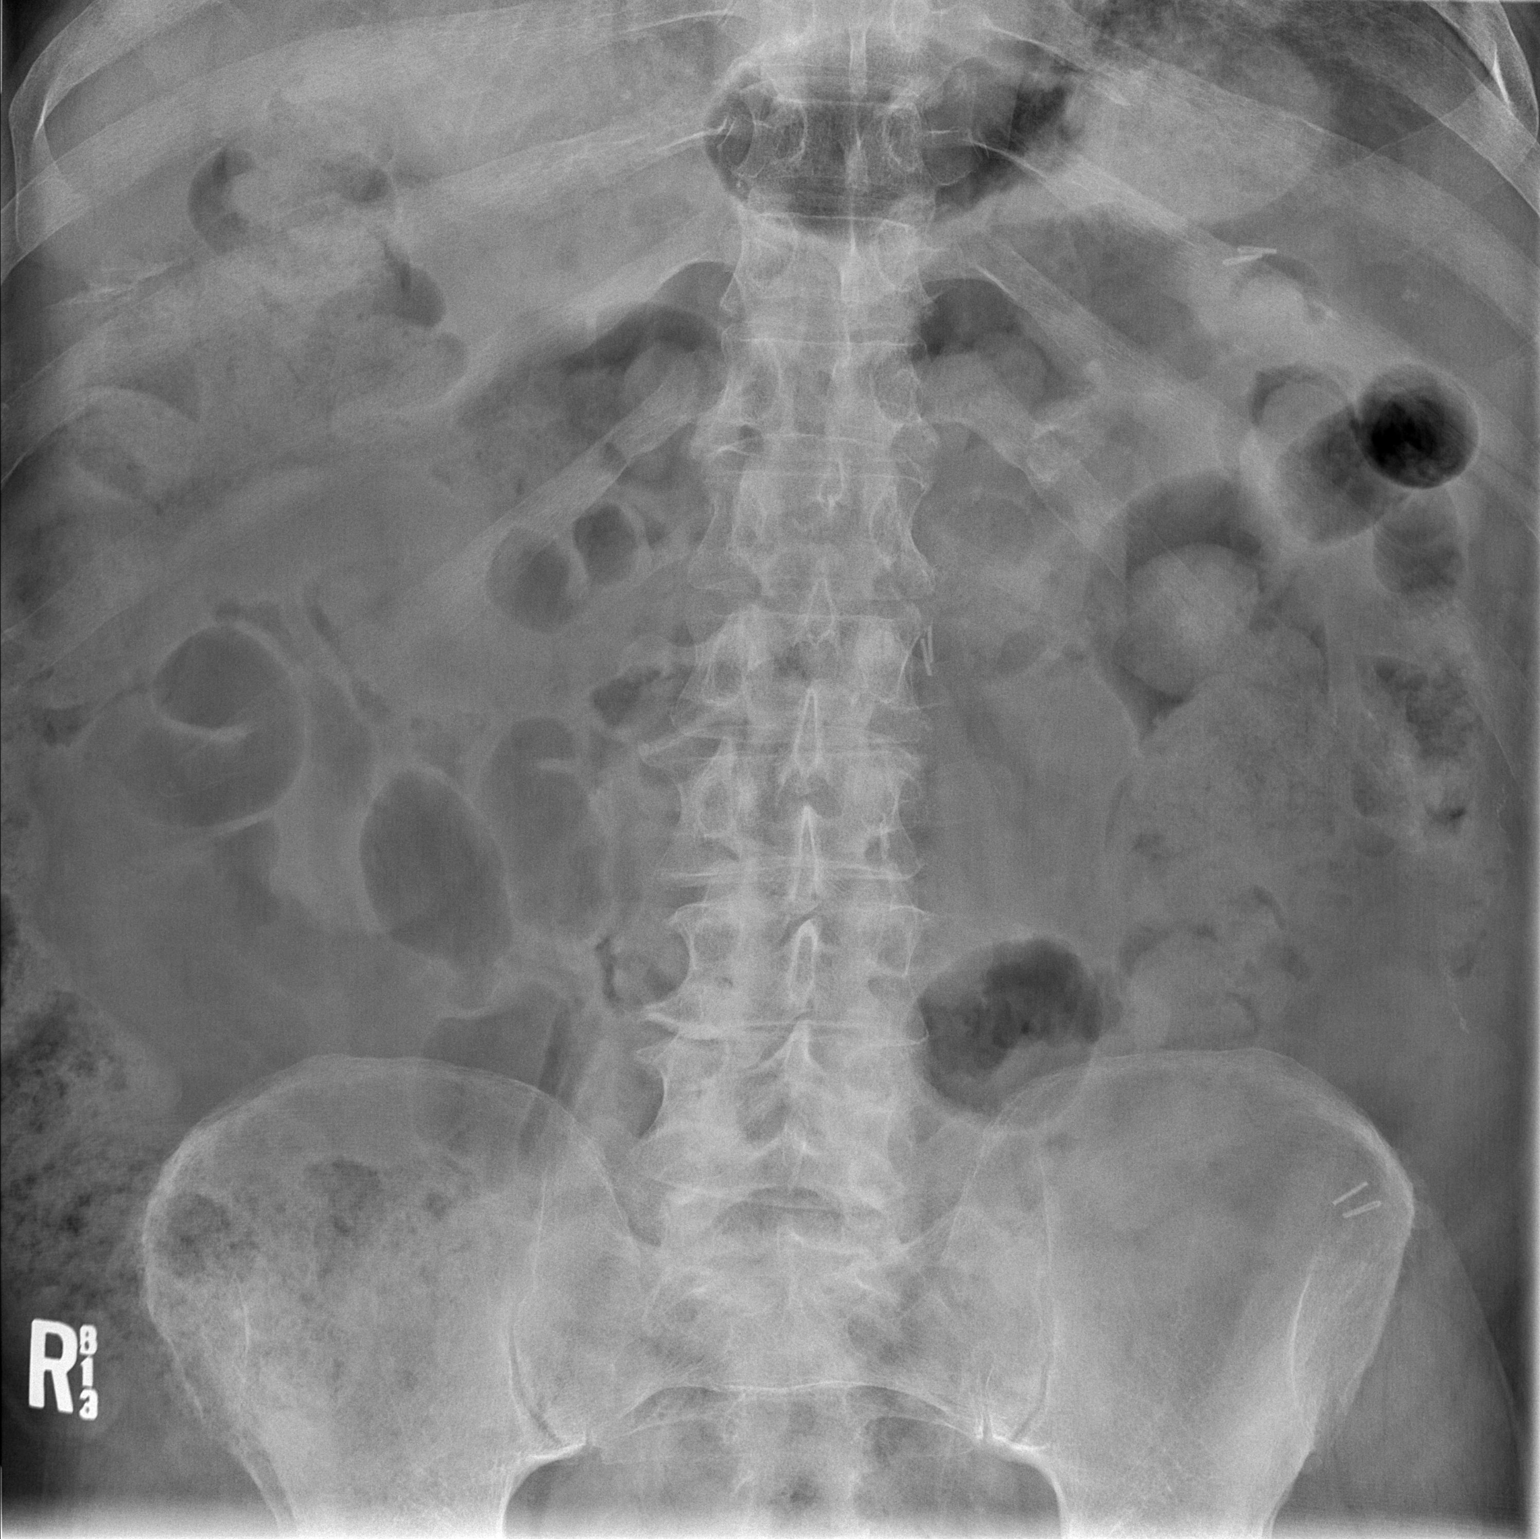

[2 of 2 positions shown; findings below may reference images not displayed]

FINDINGS: There are no disproportionally dilated loops of bowel. Moderate
stool burden throughout the colon. No obvious free intraperitoneal
gas.
IMPRESSION: Nonobstructive bowel gas pattern.  Moderate stool burden.

## 2024-05-02 DEATH — deceased
